# Patient Record
Sex: Female | Born: 1995 | Race: White | Hispanic: No | Marital: Married | Smoking: Never smoker
Health system: Southern US, Community
[De-identification: ages and names within clinical notes are randomized; demographics above are authoritative.]

## PROBLEM LIST (undated history)

## (undated) ENCOUNTER — Inpatient Hospital Stay (HOSPITAL_COMMUNITY): Payer: Self-pay

## (undated) DIAGNOSIS — F419 Anxiety disorder, unspecified: Secondary | ICD-10-CM

## (undated) DIAGNOSIS — F32A Depression, unspecified: Secondary | ICD-10-CM

## (undated) DIAGNOSIS — F329 Major depressive disorder, single episode, unspecified: Secondary | ICD-10-CM

## (undated) HISTORY — DX: Anxiety disorder, unspecified: F41.9

## (undated) HISTORY — DX: Major depressive disorder, single episode, unspecified: F32.9

## (undated) HISTORY — DX: Depression, unspecified: F32.A

## (undated) HISTORY — PX: NO PAST SURGERIES: SHX2092

---
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
## 2013-01-03 ENCOUNTER — Encounter (HOSPITAL_COMMUNITY): Payer: Self-pay | Admitting: Psychology

## 2013-01-03 DIAGNOSIS — F331 Major depressive disorder, recurrent, moderate: Secondary | ICD-10-CM

## 2013-01-03 DIAGNOSIS — F4010 Social phobia, unspecified: Secondary | ICD-10-CM

## 2013-01-03 NOTE — Progress Notes (Signed)
Outpatient Therapist Discharge Summary  JADYNN EPPING    1996-06-23   Admission Date: 05/18/12   Discharge Date:  01/02/13 Reason for Discharge:  Not active in tx since 07/06/12 Diagnosis: Major depressive disorder, recurrent episode, moderate  Social phobia   Comments:  Pt seen by Jorje Guild on 10/12/12 and indicated not looking for services at this time.  Forde Radon

## 2014-08-23 ENCOUNTER — Inpatient Hospital Stay (HOSPITAL_COMMUNITY): Payer: Medicaid Other

## 2014-08-23 ENCOUNTER — Encounter (HOSPITAL_COMMUNITY): Payer: Self-pay | Admitting: *Deleted

## 2014-08-23 ENCOUNTER — Inpatient Hospital Stay (HOSPITAL_COMMUNITY)
Admission: AD | Admit: 2014-08-23 | Discharge: 2014-08-23 | Disposition: A | Discharge status: Home / Self Care | Payer: Medicaid Other | Source: Ambulatory Visit | Attending: Obstetrics & Gynecology | Admitting: Obstetrics & Gynecology

## 2014-08-23 DIAGNOSIS — Z3490 Encounter for supervision of normal pregnancy, unspecified, unspecified trimester: Secondary | ICD-10-CM

## 2014-08-23 DIAGNOSIS — Z3A01 Less than 8 weeks gestation of pregnancy: Secondary | ICD-10-CM | POA: Insufficient documentation

## 2014-08-23 DIAGNOSIS — O26899 Other specified pregnancy related conditions, unspecified trimester: Secondary | ICD-10-CM

## 2014-08-23 DIAGNOSIS — O209 Hemorrhage in early pregnancy, unspecified: Secondary | ICD-10-CM | POA: Insufficient documentation

## 2014-08-23 DIAGNOSIS — O360111 Maternal care for anti-D [Rh] antibodies, first trimester, fetus 1: Secondary | ICD-10-CM

## 2014-08-23 DIAGNOSIS — Z87891 Personal history of nicotine dependence: Secondary | ICD-10-CM | POA: Diagnosis not present

## 2014-08-23 DIAGNOSIS — Z6791 Unspecified blood type, Rh negative: Secondary | ICD-10-CM | POA: Diagnosis present

## 2014-08-23 DIAGNOSIS — R58 Hemorrhage, not elsewhere classified: Secondary | ICD-10-CM

## 2014-08-23 LAB — WET PREP, GENITAL
Clue Cells Wet Prep HPF POC: NONE SEEN
Trich, Wet Prep: NONE SEEN
Yeast Wet Prep HPF POC: NONE SEEN

## 2014-08-23 LAB — URINALYSIS, ROUTINE W REFLEX MICROSCOPIC
Bilirubin Urine: NEGATIVE
GLUCOSE, UA: NEGATIVE mg/dL
Ketones, ur: NEGATIVE mg/dL
Leukocytes, UA: NEGATIVE
Nitrite: NEGATIVE
PH: 8.5 — AB (ref 5.0–8.0)
Protein, ur: NEGATIVE mg/dL
SPECIFIC GRAVITY, URINE: 1.015 (ref 1.005–1.030)
Urobilinogen, UA: 1 mg/dL (ref 0.0–1.0)

## 2014-08-23 LAB — CBC
HEMATOCRIT: 39.4 % (ref 36.0–46.0)
HEMOGLOBIN: 13.5 g/dL (ref 12.0–15.0)
MCH: 32.6 pg (ref 26.0–34.0)
MCHC: 34.3 g/dL (ref 30.0–36.0)
MCV: 95.2 fL (ref 78.0–100.0)
Platelets: 158 10*3/uL (ref 150–400)
RBC: 4.14 MIL/uL (ref 3.87–5.11)
RDW: 11.8 % (ref 11.5–15.5)
WBC: 6.3 10*3/uL (ref 4.0–10.5)

## 2014-08-23 LAB — URINE MICROSCOPIC-ADD ON

## 2014-08-23 LAB — POCT PREGNANCY, URINE: PREG TEST UR: POSITIVE — AB

## 2014-08-23 LAB — ABO/RH: ABO/RH(D): A NEG

## 2014-08-23 LAB — OB RESULTS CONSOLE HEPATITIS B SURFACE ANTIGEN: Hepatitis B Surface Ag: NEGATIVE

## 2014-08-23 LAB — OB RESULTS CONSOLE GC/CHLAMYDIA: GC PROBE AMP, GENITAL: NEGATIVE

## 2014-08-23 LAB — HCG, QUANTITATIVE, PREGNANCY: hCG, Beta Chain, Quant, S: 7731 m[IU]/mL — ABNORMAL HIGH (ref <5)

## 2014-08-23 LAB — OB RESULTS CONSOLE ABO/RH: ABO/RH(D): NEGATIVE

## 2014-08-23 LAB — OB RESULTS CONSOLE RUBELLA ANTIBODY, IGM: RUBELLA: IMMUNE

## 2014-08-23 LAB — OB RESULTS CONSOLE ANTIBODY SCREEN: Antibody Screen: POSITIVE

## 2014-08-23 LAB — OB RESULTS CONSOLE HIV ANTIBODY (ROUTINE TESTING): HIV: NONREACTIVE

## 2014-08-23 LAB — OB RESULTS CONSOLE RPR: RPR: NONREACTIVE

## 2014-08-23 MED ORDER — RHO D IMMUNE GLOBULIN 1500 UNIT/2ML IJ SOSY
300.0000 ug | PREFILLED_SYRINGE | Freq: Once | INTRAMUSCULAR | Status: AC
  Administered 2014-08-23: 300 ug via INTRAMUSCULAR
  Filled 2014-08-23: qty 2

## 2014-08-23 NOTE — Discharge Instructions (Signed)
First Trimester of Pregnancy °The first trimester of pregnancy is from week 1 until the end of week 12 (months 1 through 3). A week after a sperm fertilizes an egg, the egg will implant on the wall of the uterus. This embryo will begin to develop into a baby. Genes from you and your partner are forming the baby. The female genes determine whether the baby is a boy or a girl. At 6-8 weeks, the eyes and face are formed, and the heartbeat can be seen on ultrasound. At the end of 12 weeks, all the baby's organs are formed.  °Now that you are pregnant, you will want to do everything you can to have a healthy baby. Two of the most important things are to get good prenatal care and to follow your health care provider's instructions. Prenatal care is all the medical care you receive before the baby's birth. This care will help prevent, find, and treat any problems during the pregnancy and childbirth. °BODY CHANGES °Your body goes through many changes during pregnancy. The changes vary from woman to woman.  °· You may gain or lose a couple of pounds at first. °· You may feel sick to your stomach (nauseous) and throw up (vomit). If the vomiting is uncontrollable, call your health care provider. °· You may tire easily. °· You may develop headaches that can be relieved by medicines approved by your health care provider. °· You may urinate more often. Painful urination may mean you have a bladder infection. °· You may develop heartburn as a result of your pregnancy. °· You may develop constipation because certain hormones are causing the muscles that push waste through your intestines to slow down. °· You may develop hemorrhoids or swollen, bulging veins (varicose veins). °· Your breasts may begin to grow larger and become tender. Your nipples may stick out more, and the tissue that surrounds them (areola) may become darker. °· Your gums may bleed and may be sensitive to brushing and flossing. °· Dark spots or blotches (chloasma,  mask of pregnancy) may develop on your face. This will likely fade after the baby is born. °· Your menstrual periods will stop. °· You may have a loss of appetite. °· You may develop cravings for certain kinds of food. °· You may have changes in your emotions from day to day, such as being excited to be pregnant or being concerned that something may go wrong with the pregnancy and baby. °· You may have more vivid and strange dreams. °· You may have changes in your hair. These can include thickening of your hair, rapid growth, and changes in texture. Some women also have hair loss during or after pregnancy, or hair that feels dry or thin. Your hair will most likely return to normal after your baby is born. °WHAT TO EXPECT AT YOUR PRENATAL VISITS °During a routine prenatal visit: °· You will be weighed to make sure you and the baby are growing normally. °· Your blood pressure will be taken. °· Your abdomen will be measured to track your baby's growth. °· The fetal heartbeat will be listened to starting around week 10 or 12 of your pregnancy. °· Test results from any previous visits will be discussed. °Your health care provider may ask you: °· How you are feeling. °· If you are feeling the baby move. °· If you have had any abnormal symptoms, such as leaking fluid, bleeding, severe headaches, or abdominal cramping. °· If you have any questions. °Other tests   that may be performed during your first trimester include: °· Blood tests to find your blood type and to check for the presence of any previous infections. They will also be used to check for low iron levels (anemia) and Rh antibodies. Later in the pregnancy, blood tests for diabetes will be done along with other tests if problems develop. °· Urine tests to check for infections, diabetes, or protein in the urine. °· An ultrasound to confirm the proper growth and development of the baby. °· An amniocentesis to check for possible genetic problems. °· Fetal screens for  spina bifida and Down syndrome. °· You may need other tests to make sure you and the baby are doing well. °HOME CARE INSTRUCTIONS  °Medicines °· Follow your health care provider's instructions regarding medicine use. Specific medicines may be either safe or unsafe to take during pregnancy. °· Take your prenatal vitamins as directed. °· If you develop constipation, try taking a stool softener if your health care provider approves. °Diet °· Eat regular, well-balanced meals. Choose a variety of foods, such as meat or vegetable-based protein, fish, milk and low-fat dairy products, vegetables, fruits, and whole grain breads and cereals. Your health care provider will help you determine the amount of weight gain that is right for you. °· Avoid raw meat and uncooked cheese. These carry germs that can cause birth defects in the baby. °· Eating four or five small meals rather than three large meals a day may help relieve nausea and vomiting. If you start to feel nauseous, eating a few soda crackers can be helpful. Drinking liquids between meals instead of during meals also seems to help nausea and vomiting. °· If you develop constipation, eat more high-fiber foods, such as fresh vegetables or fruit and whole grains. Drink enough fluids to keep your urine clear or pale yellow. °Activity and Exercise °· Exercise only as directed by your health care provider. Exercising will help you: °¨ Control your weight. °¨ Stay in shape. °¨ Be prepared for labor and delivery. °· Experiencing pain or cramping in the lower abdomen or low back is a good sign that you should stop exercising. Check with your health care provider before continuing normal exercises. °· Try to avoid standing for long periods of time. Move your legs often if you must stand in one place for a long time. °· Avoid heavy lifting. °· Wear low-heeled shoes, and practice good posture. °· You may continue to have sex unless your health care provider directs you  otherwise. °Relief of Pain or Discomfort °· Wear a good support bra for breast tenderness.   °· Take warm sitz baths to soothe any pain or discomfort caused by hemorrhoids. Use hemorrhoid cream if your health care provider approves.   °· Rest with your legs elevated if you have leg cramps or low back pain. °· If you develop varicose veins in your legs, wear support hose. Elevate your feet for 15 minutes, 3-4 times a day. Limit salt in your diet. °Prenatal Care °· Schedule your prenatal visits by the twelfth week of pregnancy. They are usually scheduled monthly at first, then more often in the last 2 months before delivery. °· Write down your questions. Take them to your prenatal visits. °· Keep all your prenatal visits as directed by your health care provider. °Safety °· Wear your seat belt at all times when driving. °· Make a list of emergency phone numbers, including numbers for family, friends, the hospital, and police and fire departments. °General Tips °·   Ask your health care provider for a referral to a local prenatal education class. Begin classes no later than at the beginning of month 6 of your pregnancy. °· Ask for help if you have counseling or nutritional needs during pregnancy. Your health care provider can offer advice or refer you to specialists for help with various needs. °· Do not use hot tubs, steam rooms, or saunas. °· Do not douche or use tampons or scented sanitary pads. °· Do not cross your legs for long periods of time. °· Avoid cat litter boxes and soil used by cats. These carry germs that can cause birth defects in the baby and possibly loss of the fetus by miscarriage or stillbirth. °· Avoid all smoking, herbs, alcohol, and medicines not prescribed by your health care provider. Chemicals in these affect the formation and growth of the baby. °· Schedule a dentist appointment. At home, brush your teeth with a soft toothbrush and be gentle when you floss. °SEEK MEDICAL CARE IF:  °· You have  dizziness. °· You have mild pelvic cramps, pelvic pressure, or nagging pain in the abdominal area. °· You have persistent nausea, vomiting, or diarrhea. °· You have a bad smelling vaginal discharge. °· You have pain with urination. °· You notice increased swelling in your face, hands, legs, or ankles. °SEEK IMMEDIATE MEDICAL CARE IF:  °· You have a fever. °· You are leaking fluid from your vagina. °· You have spotting or bleeding from your vagina. °· You have severe abdominal cramping or pain. °· You have rapid weight gain or loss. °· You vomit blood or material that looks like coffee grounds. °· You are exposed to German measles and have never had them. °· You are exposed to fifth disease or chickenpox. °· You develop a severe headache. °· You have shortness of breath. °· You have any kind of trauma, such as from a fall or a car accident. °Document Released: 09/07/2001 Document Revised: 01/28/2014 Document Reviewed: 07/24/2013 °[employer]® Patient Information ©2015 [employer]. This information is not intended to replace advice given to you by your health care provider. Make sure you discuss any questions you have with your health care provider. °Vaginal Bleeding During Pregnancy, First Trimester °A small amount of bleeding (spotting) from the vagina is relatively common in early pregnancy. It usually stops on its own. Various things may cause bleeding or spotting in early pregnancy. Some bleeding may be related to the pregnancy, and some may not. In most cases, the bleeding is normal and is not a problem. However, bleeding can also be a sign of something serious. Be sure to tell your health care provider about any vaginal bleeding right away. °Some possible causes of vaginal bleeding during the first trimester include: °· Infection or inflammation of the cervix. °· Growths (polyps) on the cervix. °· Miscarriage or threatened miscarriage. °· Pregnancy tissue has developed outside of the uterus and in a fallopian  tube (tubal pregnancy). °· Tiny cysts have developed in the uterus instead of pregnancy tissue (molar pregnancy). °HOME CARE INSTRUCTIONS  °Watch your condition for any changes. The following actions may help to lessen any discomfort you are feeling: °· Follow your health care provider's instructions for limiting your activity. If your health care provider orders bed rest, you may need to stay in bed and only get up to use the bathroom. However, your health care provider may allow you to continue light activity. °· If needed, make plans for someone to help with your regular activities   and responsibilities while you are on bed rest. °· Keep track of the number of pads you use each day, how often you change pads, and how soaked (saturated) they are. Write this down. °· Do not use tampons. Do not douche. °· Do not have sexual intercourse or orgasms until approved by your health care provider. °· If you pass any tissue from your vagina, save the tissue so you can show it to your health care provider. °· Only take over-the-counter or prescription medicines as directed by your health care provider. °· Do not take aspirin because it can make you bleed. °· Keep all follow-up appointments as directed by your health care provider. °SEEK MEDICAL CARE IF: °· You have any vaginal bleeding during any part of your pregnancy. °· You have cramps or labor pains. °· You have a fever, not controlled by medicine. °SEEK IMMEDIATE MEDICAL CARE IF:  °· You have severe cramps in your back or belly (abdomen). °· You pass large clots or tissue from your vagina. °· Your bleeding increases. °· You feel light-headed or weak, or you have fainting episodes. °· You have chills. °· You are leaking fluid or have a gush of fluid from your vagina. °· You pass out while having a bowel movement. °MAKE SURE YOU: °· Understand these instructions. °· Will watch your condition. °· Will get help right away if you are not doing well or get worse. °Document  Released: 06/23/2005 Document Revised: 09/18/2013 Document Reviewed: 05/21/2013 °ExitCare® Patient Information ©2015 [employer]. This information is not intended to replace advice given to you by your health care provider. Make sure you discuss any questions you have with your health care provider. ° °

## 2014-08-23 NOTE — MAU Note (Signed)
Started bleeding about ago. Was dark with small clots, now pinkish.

## 2014-08-23 NOTE — MAU Provider Note (Signed)
History     CSN: [id number]  Arrival date and time: 08/23/14 1442   First Provider Initiated Contact with Patient 08/23/14 1733      Chief Complaint  Patient presents with  . Vaginal Bleeding  . Possible Pregnancy   HPI Comments: Judith Osborn 18 y.o. G1P0 [redacted]w[redacted]d presents to MAU with vaginal bleeding that started as dark red with clots and now is pink. This started after intercourse today. She has not established prenatal care. Her blood type is A negative  Vaginal Bleeding  Possible Pregnancy      Past Medical History  Diagnosis Date  . Depression   . Anxiety     Past Surgical History  Procedure Laterality Date  . No past surgeries      Family History  Problem Relation Age of Onset  . Alcohol abuse Father     inpt tx 2007  . Suicidality Father     2007 inpt tx    History  Substance Use Topics  . Smoking status: Former Games developer  . Smokeless tobacco: Never Used  . Alcohol Use: No    Allergies: No Known Allergies  Prescriptions prior to admission  Medication Sig Dispense Refill Last Dose  . Prenatal Vit-Fe Fumarate-FA (PRENATAL MULTIVITAMIN) TABS tablet Take 1 tablet by mouth daily at 12 noon.   08/22/2014 at Unknown time  . nitrofurantoin, macrocrystal-monohydrate, (MACROBID) 100 MG capsule Take 1 capsule (100 mg total) by mouth 2 (two) times daily. (Patient not taking: Reported on 08/23/2014) 10 capsule 0     Review of Systems  Constitutional: Negative.   HENT: Negative.   Eyes: Negative.   Respiratory: Negative.   Cardiovascular: Negative.   Gastrointestinal: Negative.   Genitourinary: Positive for vaginal bleeding.       Vaginal bleeding  Musculoskeletal: Negative.   Skin: Negative.   Neurological: Negative.   Psychiatric/Behavioral: The patient is nervous/anxious.    Physical Exam   Blood pressure 108/58, pulse 92, temperature 98.1 F (36.7 C), temperature source Oral, resp. rate 16, height 5\' 4"  (1.626 m), weight 76.204 kg (168 lb), last  menstrual period 07/08/2014.  Physical Exam  Constitutional: She is oriented to person, place, and time. She appears well-developed and well-nourished. No distress.  HENT:  Head: Normocephalic and atraumatic.  GI: Soft. She exhibits no distension. There is no tenderness. There is no rebound.  Genitourinary:  Genital:external negative Vaginal: tiny amount blood/ from os Cervix:closed/ thick Bimanual: nontender   Musculoskeletal: Normal range of motion.  Neurological: She is alert and oriented to person, place, and time.  Skin: Skin is warm and dry.  Psychiatric: She has a normal mood and affect. Her behavior is normal. Judgment and thought content normal.   Results for orders placed or performed during the hospital encounter of 08/23/14 (from the past 24 hour(s))  Urinalysis, Routine w reflex microscopic     Status: Abnormal   Collection Time: 08/23/14  3:05 PM  Result Value Ref Range   Color, Urine YELLOW YELLOW   APPearance CLOUDY (A) CLEAR   Specific Gravity, Urine 1.015 1.005 - 1.030   pH 8.5 (H) 5.0 - 8.0   Glucose, UA NEGATIVE NEGATIVE mg/dL   Hgb urine dipstick LARGE (A) NEGATIVE   Bilirubin Urine NEGATIVE NEGATIVE   Ketones, ur NEGATIVE NEGATIVE mg/dL   Protein, ur NEGATIVE NEGATIVE mg/dL   Urobilinogen, UA 1.0 0.0 - 1.0 mg/dL   Nitrite NEGATIVE NEGATIVE   Leukocytes, UA NEGATIVE NEGATIVE  Urine microscopic-add on     Status:  Abnormal   Collection Time: 08/23/14  3:05 PM  Result Value Ref Range   Squamous Epithelial / LPF FEW (A) RARE   WBC, UA 0-2 <3 WBC/hpf   RBC / HPF 0-2 <3 RBC/hpf   Urine-Other AMORPHOUS URATES/PHOSPHATES   Pregnancy, urine POC     Status: Abnormal   Collection Time: 08/23/14  3:18 PM  Result Value Ref Range   Preg Test, Ur POSITIVE (A) NEGATIVE  CBC     Status: None   Collection Time: 08/23/14  3:37 PM  Result Value Ref Range   WBC 6.3 4.0 - 10.5 K/uL   RBC 4.14 3.87 - 5.11 MIL/uL   Hemoglobin 13.5 12.0 - 15.0 g/dL   HCT 16.139.4 09.636.0 -  04.546.0 %   MCV 95.2 78.0 - 100.0 fL   MCH 32.6 26.0 - 34.0 pg   MCHC 34.3 30.0 - 36.0 g/dL   RDW 40.911.8 81.111.5 - 91.415.5 %   Platelets 158 150 - 400 K/uL  hCG, quantitative, pregnancy     Status: Abnormal   Collection Time: 08/23/14  3:37 PM  Result Value Ref Range   hCG, Beta Chain, Quant, S 7731 (H) <5 mIU/mL  ABO/Rh     Status: None   Collection Time: 08/23/14  3:37 PM  Result Value Ref Range   ABO/RH(D) A NEG   Wet prep, genital     Status: Abnormal   Collection Time: 08/23/14  5:53 PM  Result Value Ref Range   Yeast Wet Prep HPF POC NONE SEEN NONE SEEN   Trich, Wet Prep NONE SEEN NONE SEEN   Clue Cells Wet Prep HPF POC NONE SEEN NONE SEEN   WBC, Wet Prep HPF POC FEW (A) NONE SEEN  Rh IG workup (includes ABO/Rh)     Status: None (Preliminary result)   Collection Time: 08/23/14  5:57 PM  Result Value Ref Range   Gestational Age(Wks) 6.4    ABO/RH(D) A NEG    Antibody Screen NEG    Unit Number [id number]    Blood Component Type RHIG    Unit division 00    Status of Unit ISSUED    Transfusion Status OK TO TRANSFUSE    [place] Ob Comp Less 14 Wks  08/23/2014   CLINICAL DATA:  Pregnant, bleeding  EXAM: OBSTETRIC <14 WK US AND TRANSVAGINAL OB US  TECHNIQUE: Both transabdominal and transvaginal ultrasound examinations were performed for complete evaluation of the gestation as well as the maternal uterus, adnexal regions, and pelvic cul-de-sac. Transvaginal technique was performed to assess early pregnancy.  COMPARISON:  None.  FINDINGS: Intrauterine gestational sac: Visualized/normal in shape.  Yolk sac:  Present  Embryo:  Not visualized  MSD:  8.3  mm   5 w   3  d  US EDC: 04/22/2015  Maternal uterus/adnexae: Small subchorionic hemorrhage.  Left ovary is within normal limits, measuring 2.9 x 1.7 x 2.0 cm.  Right ovary measures 3.2 x 2.0 x 2.9 cm and is notable for a 1.7 cm probable corpus luteal cyst.  Trace pelvic fluid.  IMPRESSION: Single intrauterine gestational sac with yolk sac, measuring  5 weeks 3 days by mean sac diameter.  No fetal pole is visualized. Consider follow-up pelvic ultrasound in 14 days to confirm viability.   Electronically Signed   By: Charline BillsSriyesh  Krishnan M.D.   On: 08/23/2014 18:35   [place] Ob Transvaginal  08/23/2014   CLINICAL DATA:  Pregnant, bleeding  EXAM: OBSTETRIC <14 WK US AND TRANSVAGINAL OB US  TECHNIQUE:  Both transabdominal and transvaginal ultrasound examinations were performed for complete evaluation of the gestation as well as the maternal uterus, adnexal regions, and pelvic cul-de-sac. Transvaginal technique was performed to assess early pregnancy.  COMPARISON:  None.  FINDINGS: Intrauterine gestational sac: Visualized/normal in shape.  Yolk sac:  Present  Embryo:  Not visualized  MSD:  8.3  mm   5 w   3  d  US EDC: 04/22/2015  Maternal uterus/adnexae: Small subchorionic hemorrhage.  Left ovary is within normal limits, measuring 2.9 x 1.7 x 2.0 cm.  Right ovary measures 3.2 x 2.0 x 2.9 cm and is notable for a 1.7 cm probable corpus luteal cyst.  Trace pelvic fluid.  IMPRESSION: Single intrauterine gestational sac with yolk sac, measuring 5 weeks 3 days by mean sac diameter.  No fetal pole is visualized. Consider follow-up pelvic ultrasound in 14 days to confirm viability.   Electronically Signed   By: Charline BillsSriyesh  Krishnan M.D.   On: 08/23/2014 18:35    MAU Course  Procedures  MDM Wet prep, GC, Chlamydia, CBC, UA, U/S, ABORh, Quant Negative blood type/ RHIG and Rhophylac ordered  Assessment and Plan   A: Bleeding in early pregnancy  P: Above orders Reviewed all labs and ultrasound with patient and FOB Rhophylac 300 mcg IM Advised to [facility] and start [facility] Return to MAU as needed  Carolynn ServeBarefoot, Cynitha Berte Miller 08/23/2014, 6:00 PM

## 2014-08-24 LAB — GC/CHLAMYDIA PROBE AMP
CT Probe RNA: NEGATIVE
GC Probe RNA: NEGATIVE

## 2014-08-24 LAB — RH IG WORKUP (INCLUDES ABO/RH)
ABO/RH(D): A NEG
Antibody Screen: NEGATIVE
GESTATIONAL AGE(WKS): 6.4
Unit division: 0

## 2014-08-25 LAB — CULTURE, OB URINE: SPECIAL REQUESTS: NORMAL

## 2014-08-28 ENCOUNTER — Inpatient Hospital Stay (HOSPITAL_COMMUNITY)
Admission: AD | Admit: 2014-08-28 | Discharge: 2014-08-28 | Disposition: A | Discharge status: Home / Self Care | Payer: Medicaid Other | Source: Ambulatory Visit | Attending: Obstetrics & Gynecology | Admitting: Obstetrics & Gynecology

## 2014-08-28 ENCOUNTER — Encounter (HOSPITAL_COMMUNITY): Payer: Self-pay

## 2014-08-28 DIAGNOSIS — O360910 Maternal care for other rhesus isoimmunization, first trimester, not applicable or unspecified: Secondary | ICD-10-CM | POA: Insufficient documentation

## 2014-08-28 DIAGNOSIS — O209 Hemorrhage in early pregnancy, unspecified: Secondary | ICD-10-CM | POA: Diagnosis not present

## 2014-08-28 DIAGNOSIS — O4691 Antepartum hemorrhage, unspecified, first trimester: Secondary | ICD-10-CM

## 2014-08-28 DIAGNOSIS — Z3A01 Less than 8 weeks gestation of pregnancy: Secondary | ICD-10-CM | POA: Insufficient documentation

## 2014-08-28 DIAGNOSIS — Z87891 Personal history of nicotine dependence: Secondary | ICD-10-CM | POA: Insufficient documentation

## 2014-08-28 DIAGNOSIS — O360111 Maternal care for anti-D [Rh] antibodies, first trimester, fetus 1: Secondary | ICD-10-CM

## 2014-08-28 NOTE — Discharge Instructions (Signed)
Vaginal Bleeding During Pregnancy, First Trimester °A small amount of bleeding (spotting) from the vagina is relatively common in early pregnancy. It usually stops on its own. Various things may cause bleeding or spotting in early pregnancy. Some bleeding may be related to the pregnancy, and some may not. In most cases, the bleeding is normal and is not a problem. However, bleeding can also be a sign of something serious. Be sure to tell your health care provider about any vaginal bleeding right away. °Some possible causes of vaginal bleeding during the first trimester include: °· Infection or inflammation of the cervix. °· Growths (polyps) on the cervix. °· Miscarriage or threatened miscarriage. °· Pregnancy tissue has developed outside of the uterus and in a fallopian tube (tubal pregnancy). °· Tiny cysts have developed in the uterus instead of pregnancy tissue (molar pregnancy). °HOME CARE INSTRUCTIONS  °Watch your condition for any changes. The following actions may help to lessen any discomfort you are feeling: °· Follow your health care provider's instructions for limiting your activity. If your health care provider orders bed rest, you may need to stay in bed and only get up to use the bathroom. However, your health care provider may allow you to continue light activity. °· If needed, make plans for someone to help with your regular activities and responsibilities while you are on bed rest. °· Keep track of the number of pads you use each day, how often you change pads, and how soaked (saturated) they are. Write this down. °· Do not use tampons. Do not douche. °· Do not have sexual intercourse or orgasms until approved by your health care provider. °· If you pass any tissue from your vagina, save the tissue so you can show it to your health care provider. °· Only take over-the-counter or prescription medicines as directed by your health care provider. °· Do not take aspirin because it can make you  bleed. °· Keep all follow-up appointments as directed by your health care provider. °SEEK MEDICAL CARE IF: °· You have any vaginal bleeding during any part of your pregnancy. °· You have cramps or labor pains. °· You have a fever, not controlled by medicine. °SEEK IMMEDIATE MEDICAL CARE IF:  °· You have severe cramps in your back or belly (abdomen). °· You pass large clots or tissue from your vagina. °· Your bleeding increases. °· You feel light-headed or weak, or you have fainting episodes. °· You have chills. °· You are leaking fluid or have a gush of fluid from your vagina. °· You pass out while having a bowel movement. °MAKE SURE YOU: °· Understand these instructions. °· Will watch your condition. °· Will get help right away if you are not doing well or get worse. °Document Released: 06/23/2005 Document Revised: 09/18/2013 Document Reviewed: 05/21/2013 °ExitCare® Patient Information ©2015 [employer]. This information is not intended to replace advice given to you by your health care provider. Make sure you discuss any questions you have with your health care provider. ° °Rh Incompatibility °Rh incompatibility is a condition that occurs during pregnancy if a woman has Rh-negative blood and her baby has Rh-positive blood. "Rh-negative" and "Rh-positive" refer to whether or not the blood has an Rh factor. An Rh factor is a specific protein found on the surface of red blood cells. If a woman has Rh factor, she is Rh-positive. If she does not have an Rh factor, she is Rh-negative. Having or not having an Rh factor does not affect the mother's general   health. However, it can cause problems during pregnancy.  °WHAT KIND OF PROBLEMS CAN Rh INCOMPATIBILITY CAUSE? °During pregnancy, blood from the baby can cross into the mother's bloodstream, especially during delivery. If a mother is Rh-negative and the baby is Rh-positive, the mother's defense system will react to the baby's blood as if it was a foreign substance and  will create proteins (antibodies). This is called sensitization. Once the mother is sensitized, her Rh antibodies will cross the placenta to the baby and attack the baby's Rh-positive blood as if it is a harmful substance.  °Rh incompatibility can also happen if the Rh-negative pregnant woman is exposed to the Rh factor during a blood transfusion with Rh-positive blood.  °HOW DOES THIS CONDITION AFFECT MY BABY? °The Rh antibodies that attack and destroy the baby's red blood cells can lead to hemolytic disease in the baby. Hemolytic disease is when the red blood cells break down. This can cause:  °· Yellowing of the skin and eyes (jaundice). °· The body to not have enough healthy red blood cells (anemia).   °· Brain damage.   °· Heart failure.   °· Death.   °These antibodies usually do not cause problems during a first pregnancy. This is because the blood from the baby often times crosses into the mother's bloodstream during delivery, and the baby is born before many of the antibodies can develop. However, the antibodies stay in your body once they have formed. Because of this, Rh incompatibility is more likely to cause problems in second or later pregnancies (if the baby is Rh-positive).  °HOW IS THIS CONDITION DIAGNOSED? °When a woman becomes pregnant, blood tests may be done to find out her blood type and Rh factor. If the woman is Rh-negative, she also may have another blood test called an antibody screen. The antibody screen shows whether she has Rh antibodies in her blood. If she does, it means she was exposed to Rh-positive blood before, and she is at risk for Rh incompatibility.   °To find out whether the baby is developing hemolytic anemia and how serious it is, caregivers may use more advanced tests, such as ultrasonography (commonly known as ultrasound).  °HOW IS Rh INCOMPATIBILITY TREATED?  °Rh incompatibility is treated with a shot of medicine called Rho (D) immune globulin. This medicine keeps the  woman's body from making antibodies that can cause serious problems in the baby or future babies.  °Two shots will be given, one at around your seventh month of pregnancy and the other within 72 hours of your baby being born. If you are Rh-negative, you will need this medicine every time you have a baby with Rh-positive blood. If you already have antibodies in your blood, Rho (D) immune globulin will not help. Your doctor will not give you this medicine, but will watch your pregnancy closely for problems instead.   °This shot may also be given to an Rh-negative woman when the risk of blood transfer between the mom and baby is high. The risk is high with:  °· An amniocentesis.   °· A miscarriage or an abortion.   °· An ectopic pregnancy.   °· Any vaginal bleeding during pregnancy.   °Document Released: 03/05/2002 Document Revised: 09/18/2013 Document Reviewed: 12/26/2012 °ExitCare® Patient Information ©2015 [employer]. This information is not intended to replace advice given to you by your health care provider. Make sure you discuss any questions you have with your health care provider. ° ° °

## 2014-08-28 NOTE — MAU Provider Note (Signed)
  History     CSN: [id number]  Arrival date and time: 08/28/14 [id number]   First Provider Initiated Contact with Patient 08/28/14 2041      Chief Complaint  Patient presents with  . Vaginal Bleeding   HPI Comments: Judith Osborn 18 y.o. G1P0 [id number] presents to MAU with vaginal bleeding after intercourse. She was under the impression that every bleeding episode would require Rhogam as she is RH negative. She has not established prenatal care as yet.   Vaginal Bleeding      Past Medical History  Diagnosis Date  . Depression   . Anxiety     Past Surgical History  Procedure Laterality Date  . No past surgeries      Family History  Problem Relation Age of Onset  . Alcohol abuse Father     inpt tx 2007  . Suicidality Father     2007 inpt tx    History  Substance Use Topics  . Smoking status: Former Games developermoker  . Smokeless tobacco: Never Used  . Alcohol Use: No    Allergies: No Known Allergies  Prescriptions prior to admission  Medication Sig Dispense Refill Last Dose  . Prenatal Vit-Fe Fumarate-FA (PRENATAL MULTIVITAMIN) TABS tablet Take 1 tablet by mouth daily at 12 noon.   08/22/2014 at Unknown time    Review of Systems  Constitutional: Negative.   HENT: Negative.   Eyes: Negative.   Respiratory: Negative.   Cardiovascular: Negative.   Genitourinary: Positive for vaginal bleeding.       Vaginal bleeding  Skin: Negative.   Neurological: Negative.   Psychiatric/Behavioral: Negative.    Physical Exam   Blood pressure 118/67, pulse 75, temperature 98.8 F (37.1 C), temperature source Oral, resp. rate 16, last menstrual period 07/08/2014, SpO2 99 %.  Physical Exam  Constitutional: She appears well-developed and well-nourished. No distress.  HENT:  Head: Normocephalic and atraumatic.  Respiratory: Effort normal.  GI: Soft.  Genitourinary:  Genital:external negative Vaginal:small amount pink discharge Cervix:closed and thick Bimanual:nontender    Musculoskeletal: Normal range of motion.  Neurological: She is alert.  Skin: Skin is warm and dry.  Psychiatric: She has a normal mood and affect. Her behavior is normal. Judgment and thought content normal.   No results found for this or any previous visit (from the past 24 hour(s)).  MAU Course  Procedures  MDM   Assessment and Plan   A: Vaginal bleeding RH negative  P: Reassurance that she is covered for 14 weeks Establish prenatal care Continue PNV Return to MAU as needed  Carolynn ServeBarefoot, Tylen Leverich Miller 08/28/2014, 8:42 PM

## 2014-08-28 NOTE — MAU Note (Signed)
Patient states has had spotting today after having intercourse last night. Denies pain.

## 2014-09-27 NOTE — L&D Delivery Note (Signed)
Delivery Note  At 11:25 AM a viable and healthy female Zollie Scale)  was delivered by [employer], CNM via Vaginal, Spontaneous Delivery (Presentation: Left Occiput Anterior).  APGAR: 7, 9; weight 7 lb 10.1 oz (3460 g).   Placenta status: Intact, Spontaneous.  Cord: 3 vessels with the following complications: None.  Anesthesia: Epidural Local  Episiotomy: None Lacerations: 2nd degree Suture Repair: 3.0 vicryl Est. Blood Loss (mL):  453 cc's.   Mom to postpartum.   Baby A to Couplet care / Skin to Skin.   Baby B to NA.  Kirkland Hun V 04/22/2015, 12:09 PM

## 2014-12-21 ENCOUNTER — Inpatient Hospital Stay (HOSPITAL_COMMUNITY)
Admission: AD | Admit: 2014-12-21 | Discharge: 2014-12-21 | Disposition: A | Discharge status: Home / Self Care | Payer: Medicaid Other | Source: Ambulatory Visit | Attending: Obstetrics and Gynecology | Admitting: Obstetrics and Gynecology

## 2014-12-21 ENCOUNTER — Encounter (HOSPITAL_COMMUNITY): Payer: Self-pay | Admitting: *Deleted

## 2014-12-21 DIAGNOSIS — R109 Unspecified abdominal pain: Secondary | ICD-10-CM | POA: Diagnosis present

## 2014-12-21 DIAGNOSIS — K30 Functional dyspepsia: Secondary | ICD-10-CM | POA: Diagnosis not present

## 2014-12-21 DIAGNOSIS — Z6791 Unspecified blood type, Rh negative: Secondary | ICD-10-CM | POA: Insufficient documentation

## 2014-12-21 DIAGNOSIS — Z3A22 22 weeks gestation of pregnancy: Secondary | ICD-10-CM | POA: Insufficient documentation

## 2014-12-21 DIAGNOSIS — O26893 Other specified pregnancy related conditions, third trimester: Secondary | ICD-10-CM | POA: Insufficient documentation

## 2014-12-21 DIAGNOSIS — Z87891 Personal history of nicotine dependence: Secondary | ICD-10-CM | POA: Insufficient documentation

## 2014-12-21 DIAGNOSIS — O9989 Other specified diseases and conditions complicating pregnancy, childbirth and the puerperium: Secondary | ICD-10-CM | POA: Diagnosis not present

## 2014-12-21 LAB — URINALYSIS, ROUTINE W REFLEX MICROSCOPIC
BILIRUBIN URINE: NEGATIVE
GLUCOSE, UA: NEGATIVE mg/dL
Hgb urine dipstick: NEGATIVE
KETONES UR: NEGATIVE mg/dL
LEUKOCYTES UA: NEGATIVE
Nitrite: NEGATIVE
Protein, ur: NEGATIVE mg/dL
Urobilinogen, UA: 1 mg/dL (ref 0.0–1.0)
pH: 6 (ref 5.0–8.0)

## 2014-12-21 LAB — CBC
HCT: 32.6 % — ABNORMAL LOW (ref 36.0–46.0)
Hemoglobin: 11.6 g/dL — ABNORMAL LOW (ref 12.0–15.0)
MCH: 33.6 pg (ref 26.0–34.0)
MCHC: 35.6 g/dL (ref 30.0–36.0)
MCV: 94.5 fL (ref 78.0–100.0)
Platelets: 130 10*3/uL — ABNORMAL LOW (ref 150–400)
RBC: 3.45 MIL/uL — AB (ref 3.87–5.11)
RDW: 13.2 % (ref 11.5–15.5)
WBC: 7.9 10*3/uL (ref 4.0–10.5)

## 2014-12-21 MED ORDER — RANITIDINE HCL 150 MG PO TABS: 150.0000 mg | ORAL_TABLET | Freq: Two times a day (BID) | ORAL | Status: AC

## 2014-12-21 NOTE — MAU Provider Note (Signed)
History   19 yo G1P0 at 64 4/7 weeks presented unannounced c/o mid-abdominal pain since around 0230.  Denies N/V, diarrhea/constipation, back pain, dysuria, fever, viral exposures, uterine cramping, discharge, or bleeding. Has been "very gassy", with frequent flatus and burping. Did eat greasy food night before last (not usual diet).  Has struggled with N/V during pregnancy, but has been doing well over the last few weeks.  Reports normal appetite over last few days.  Patient Active Problem List   Diagnosis Date Noted  . Pregnancy 08/23/2014  . Rh negative state in antepartum period 08/23/2014    Chief Complaint  Patient presents with  . Abdominal Pain   HPI:  See above  OB History    Gravida Para Term Preterm AB TAB SAB Ectopic Multiple Living   1               Past Medical History  Diagnosis Date  . Depression   . Anxiety     Past Surgical History  Procedure Laterality Date  . No past surgeries      Family History  Problem Relation Age of Onset  . Alcohol abuse Father     inpt tx 2007  . Suicidality Father     2007 inpt tx    History  Substance Use Topics  . Smoking status: Former Smoker    Types: Cigarettes  . Smokeless tobacco: Never Used  . Alcohol Use: No    Allergies: No Known Allergies  Prescriptions prior to admission  Medication Sig Dispense Refill Last Dose  . calcium carbonate (TUMS - DOSED IN MG ELEMENTAL CALCIUM) 500 MG chewable tablet Chew 2 tablets by mouth daily.   12/21/2014 at Unknown time  . Prenatal Vit-Fe Fumarate-FA (PRENATAL MULTIVITAMIN) TABS tablet Take 1 tablet by mouth daily at 12 noon.   Past Month at Unknown time    ROS:  Mild mid-abdominal pain, +FM Physical Exam   Blood pressure 124/69, pulse 92, temperature 98.4 F (36.9 C), resp. rate 18, height  (1.651 m), weight 179 lb (81.194 kg), last menstrual period 07/08/2014, SpO2 100 %.  Results for orders placed or performed during the hospital encounter of 12/21/14 (from  the past 24 hour(s))  Urinalysis, Routine w reflex microscopic     Status: Abnormal   Collection Time: 12/21/14  5:05 AM  Result Value Ref Range   Color, Urine YELLOW YELLOW   APPearance CLEAR CLEAR   Specific Gravity, Urine >1.030 (H) 1.005 - 1.030   pH 6.0 5.0 - 8.0   Glucose, UA NEGATIVE NEGATIVE mg/dL   Hgb urine dipstick NEGATIVE NEGATIVE   Bilirubin Urine NEGATIVE NEGATIVE   Ketones, ur NEGATIVE NEGATIVE mg/dL   Protein, ur NEGATIVE NEGATIVE mg/dL   Urobilinogen, UA 1.0 0.0 - 1.0 mg/dL   Nitrite NEGATIVE NEGATIVE   Leukocytes, UA NEGATIVE NEGATIVE  CBC     Status: Abnormal   Collection Time: 12/21/14  5:39 AM  Result Value Ref Range   WBC 7.9 4.0 - 10.5 K/uL   RBC 3.45 (L) 3.87 - 5.11 MIL/uL   Hemoglobin 11.6 (L) 12.0 - 15.0 g/dL   HCT 16.1 (L) 09.6 - 04.5 %   MCV 94.5 78.0 - 100.0 fL   MCH 33.6 26.0 - 34.0 pg   MCHC 35.6 30.0 - 36.0 g/dL   RDW 40.9 81.1 - 91.4 %   Platelets 130 (L) 150 - 400 K/uL    Physical Exam  In NAD Chest clear Heart RRR without murmur Abd gravid,  NT.  No rebound or guarding. + bowel sounds in all quadrants. Pelvic--cervix closed, long.  No d/c in vault. Ext WNL  FHR 156 per doppler    ED Course  Assessment: IUP at 22 4/7 weeks Likely digestive issue  Plan: D/C home Rx Zantac Recommended OTC simethicone Dietary recommendations for bland diet Keep scheduled ROB appt 12/27/14. Call for any worsening of sx.   Nigel BridgemanLATHAM, Casha Estupinan CNM, MSN 12/21/2014 8:11 AM

## 2014-12-21 NOTE — Progress Notes (Signed)
Called Venus Standard CNM who is in delivery. Dr Stefano GaulSTringer took call and notified of pt's admission and status. CBC ordered and provider will see pt after delivery

## 2014-12-21 NOTE — Discharge Instructions (Signed)
Take gas-reducing medication (Gas-X, Phazyme, Mylanta Gas) per package directions. Keep diet bland for the next few days--no high-fat/greasy/spicy foods.  Gastroesophageal Reflux Disease, Adult Gastroesophageal reflux disease (GERD) happens when acid from your stomach flows up into the esophagus. When acid comes in contact with the esophagus, the acid causes soreness (inflammation) in the esophagus. Over time, GERD may create small holes (ulcers) in the lining of the esophagus. CAUSES   Increased body weight. This puts pressure on the stomach, making acid rise from the stomach into the esophagus.  Smoking. This increases acid production in the stomach.  Drinking alcohol. This causes decreased pressure in the lower esophageal sphincter (valve or ring of muscle between the esophagus and stomach), allowing acid from the stomach into the esophagus.  Late evening meals and a full stomach. This increases pressure and acid production in the stomach.  A malformed lower esophageal sphincter. Sometimes, no cause is found. SYMPTOMS   Burning pain in the lower part of the mid-chest behind the breastbone and in the mid-stomach area. This may occur twice a week or more often.  Trouble swallowing.  Sore throat.  Dry cough.  Asthma-like symptoms including chest tightness, shortness of breath, or wheezing. DIAGNOSIS  Your caregiver may be able to diagnose GERD based on your symptoms. In some cases, X-rays and other tests may be done to check for complications or to check the condition of your stomach and esophagus. TREATMENT  Your caregiver may recommend over-the-counter or prescription medicines to help decrease acid production. Ask your caregiver before starting or adding any new medicines.  HOME CARE INSTRUCTIONS   Change the factors that you can control. Ask your caregiver for guidance concerning weight loss, quitting smoking, and alcohol consumption.  Avoid foods and drinks that make your  symptoms worse, such as:  Caffeine or alcoholic drinks.  Chocolate.  Peppermint or mint flavorings.  Garlic and onions.  Spicy foods.  Citrus fruits, such as oranges, lemons, or limes.  Tomato-based foods such as sauce, chili, salsa, and pizza.  Fried and fatty foods.  Avoid lying down for the 3 hours prior to your bedtime or prior to taking a nap.  Eat small, frequent meals instead of large meals.  Wear loose-fitting clothing. Do not wear anything tight around your waist that causes pressure on your stomach.  Raise the head of your bed 6 to 8 inches with wood blocks to help you sleep. Extra pillows will not help.  Only take over-the-counter or prescription medicines for pain, discomfort, or fever as directed by your caregiver.  Do not take aspirin, ibuprofen, or other nonsteroidal anti-inflammatory drugs (NSAIDs). SEEK IMMEDIATE MEDICAL CARE IF:   You have pain in your arms, neck, jaw, teeth, or back.  Your pain increases or changes in intensity or duration.  You develop nausea, vomiting, or sweating (diaphoresis).  You develop shortness of breath, or you faint.  Your vomit is green, yellow, black, or looks like coffee grounds or blood.  Your stool is red, bloody, or black. These symptoms could be signs of other problems, such as heart disease, gastric bleeding, or esophageal bleeding. MAKE SURE YOU:   Understand these instructions.  Will watch your condition.  Will get help right away if you are not doing well or get worse. Document Released: 06/23/2005 Document Revised: 12/06/2011 Document Reviewed: 04/02/2011 [facility] Patient Information 2015 [place], [place]. This information is not intended to replace advice given to you by your health care provider. Make sure you discuss any questions you have  with your health care provider.

## 2014-12-21 NOTE — MAU Note (Addendum)
I think i have gas. Had indigestion all day yesterday. Pain is in lower abd and comes in sharp bursts. Denies vag bleeding or no LOF. Clear mucousy d/c. States burps smell like hard-boiled eggs

## 2015-03-20 LAB — OB RESULTS CONSOLE GBS: STREP GROUP B AG: NEGATIVE

## 2015-03-24 ENCOUNTER — Inpatient Hospital Stay (HOSPITAL_COMMUNITY)
Admission: AD | Admit: 2015-03-24 | Discharge: 2015-03-24 | Disposition: A | Discharge status: Home / Self Care | Payer: Medicaid Other | Source: Ambulatory Visit | Attending: Obstetrics & Gynecology | Admitting: Obstetrics & Gynecology

## 2015-03-24 DIAGNOSIS — O26899 Other specified pregnancy related conditions, unspecified trimester: Secondary | ICD-10-CM

## 2015-03-24 DIAGNOSIS — Z8659 Personal history of other mental and behavioral disorders: Secondary | ICD-10-CM

## 2015-03-24 DIAGNOSIS — Z3493 Encounter for supervision of normal pregnancy, unspecified, third trimester: Secondary | ICD-10-CM | POA: Insufficient documentation

## 2015-03-24 DIAGNOSIS — R109 Unspecified abdominal pain: Secondary | ICD-10-CM

## 2015-03-24 DIAGNOSIS — Z3A36 36 weeks gestation of pregnancy: Secondary | ICD-10-CM | POA: Insufficient documentation

## 2015-03-24 LAB — URINALYSIS, ROUTINE W REFLEX MICROSCOPIC
Bilirubin Urine: NEGATIVE
Glucose, UA: NEGATIVE mg/dL
Hgb urine dipstick: NEGATIVE
Ketones, ur: NEGATIVE mg/dL
LEUKOCYTES UA: NEGATIVE
NITRITE: NEGATIVE
PH: 6.5 (ref 5.0–8.0)
Protein, ur: 30 mg/dL — AB
SPECIFIC GRAVITY, URINE: 1.025 (ref 1.005–1.030)
UROBILINOGEN UA: 4 mg/dL — AB (ref 0.0–1.0)

## 2015-03-24 LAB — WET PREP, GENITAL
Clue Cells Wet Prep HPF POC: NONE SEEN
Trich, Wet Prep: NONE SEEN
Yeast Wet Prep HPF POC: NONE SEEN

## 2015-03-24 LAB — URINE MICROSCOPIC-ADD ON

## 2015-03-24 NOTE — MAU Note (Signed)
PT presents complaining of cramping that is "very intense." Denies vaginal bleeding or leaking of fluid. Reports good fetal movement.

## 2015-03-24 NOTE — MAU Provider Note (Signed)
History  19 yo G1P0 @ 36.0 wks presents unannounced to MAU w/ c/o painful cramping while at work, decreased since arrival. +FM. Denies VB or LOF.  Patient Active Problem List   Diagnosis Date Noted  . Cramping affecting pregnancy, antepartum 03/24/2015  . History of depression 03/24/2015  . History of anxiety 03/24/2015  . Rh negative state in antepartum period 08/23/2014    Chief Complaint  Patient presents with  . Abdominal Pain   HPI As above OB History    Gravida Para Term Preterm AB TAB SAB Ectopic Multiple Living   1               Past Medical History  Diagnosis Date  . Depression   . Anxiety     Past Surgical History  Procedure Laterality Date  . No past surgeries      Family History  Problem Relation Age of Onset  . Alcohol abuse Father     inpt tx 2007  . Suicidality Father     2007 inpt tx    History  Substance Use Topics  . Smoking status: Former Smoker    Types: Cigarettes  . Smokeless tobacco: Never Used  . Alcohol Use: No    Allergies: No Known Allergies  Prescriptions prior to admission  Medication Sig Dispense Refill Last Dose  . acetaminophen (TYLENOL) 500 MG tablet Take 500 mg by mouth every 6 (six) hours as needed for moderate pain or headache.   03/23/2015 at Unknown time  . Prenatal Vit-Fe Fumarate-FA (PRENATAL MULTIVITAMIN) TABS tablet Take 2 tablets by mouth daily at 12 noon.    Past Week at Unknown time  . ranitidine (ZANTAC) 150 MG tablet Take 1 tablet (150 mg total) by mouth 2 (two) times daily. (Patient taking differently: Take 150 mg by mouth 2 (two) times daily as needed for heartburn. ) 60 tablet 2 03/24/2015 at 1100  . sertraline (ZOLOFT) 50 MG tablet Take 50 mg by mouth daily.   Past Week at Unknown time    ROS  +FM +cramping -VB -LOF Physical Exam   Results for orders placed or performed during the hospital encounter of 03/24/15 (from the past 24 hour(s))  Urinalysis, Routine w reflex microscopic (not at [facility])      Status: Abnormal   Collection Time: 03/24/15  6:48 PM  Result Value Ref Range   Color, Urine YELLOW YELLOW   APPearance CLEAR CLEAR   Specific Gravity, Urine 1.025 1.005 - 1.030   pH 6.5 5.0 - 8.0   Glucose, UA NEGATIVE NEGATIVE mg/dL   Hgb urine dipstick NEGATIVE NEGATIVE   Bilirubin Urine NEGATIVE NEGATIVE   Ketones, ur NEGATIVE NEGATIVE mg/dL   Protein, ur 30 (A) NEGATIVE mg/dL   Urobilinogen, UA 4.0 (H) 0.0 - 1.0 mg/dL   Nitrite NEGATIVE NEGATIVE   Leukocytes, UA NEGATIVE NEGATIVE  Urine microscopic-add on     Status: Abnormal   Collection Time: 03/24/15  6:48 PM  Result Value Ref Range   Squamous Epithelial / LPF FEW (A) RARE   WBC, UA 0-2 <3 WBC/hpf   RBC / HPF 0-2 <3 RBC/hpf   Bacteria, UA FEW (A) RARE   Urine-Other MUCOUS PRESENT   Wet prep, genital     Status: Abnormal   Collection Time: 03/24/15  8:30 PM  Result Value Ref Range   Yeast Wet Prep HPF POC NONE SEEN NONE SEEN   Trich, Wet Prep NONE SEEN NONE SEEN   Clue Cells Wet Prep HPF POC NONE  SEEN NONE SEEN   WBC, Wet Prep HPF POC FEW (A) NONE SEEN   Blood pressure 117/70, pulse 75, temperature 98 F (36.7 C), resp. rate 18, height 5\' 5"  (1.651 m), weight 88.905 kg (196 lb), last menstrual period 07/08/2014.   Physical Exam Gen: NAD Abdomen: gravid, soft, NT, no guarding or rebound Speculum: Moderate amount of white discharge, no odor, sample collected for GC/CT and wet prep Pelvic: Cvx long/closed FHRT: Cat 1 U/Cs: None ED Course  UA Urine culture Wet prep GC/CT Assessment: PTCs likely brought on by increased activity at work -- resolved w/ rest in MAU. Lab results as above.  Plan: PTL precautions Work note OB f/u as previously scheduled   Sherre ScarletWILLIAMS, Judea Fennimore CNM, MS 03/24/2015 10:38 PM

## 2015-03-25 LAB — GC/CHLAMYDIA PROBE AMP (~~LOC~~) NOT AT ARMC
CHLAMYDIA, DNA PROBE: NEGATIVE
NEISSERIA GONORRHEA: NEGATIVE

## 2015-03-27 LAB — CULTURE, OB URINE

## 2015-04-17 ENCOUNTER — Telehealth (HOSPITAL_COMMUNITY): Payer: Self-pay | Admitting: *Deleted

## 2015-04-17 NOTE — Telephone Encounter (Signed)
Preadmission screen  

## 2015-04-21 ENCOUNTER — Inpatient Hospital Stay (HOSPITAL_COMMUNITY)
Admission: AD | Admit: 2015-04-21 | Discharge: 2015-04-23 | DRG: 775 | Disposition: A | Discharge status: Home / Self Care | Payer: Medicaid Other | Source: Ambulatory Visit | Attending: Obstetrics and Gynecology | Admitting: Obstetrics and Gynecology

## 2015-04-21 ENCOUNTER — Encounter (HOSPITAL_COMMUNITY): Payer: Self-pay

## 2015-04-21 DIAGNOSIS — D649 Anemia, unspecified: Secondary | ICD-10-CM | POA: Diagnosis not present

## 2015-04-21 DIAGNOSIS — O99284 Endocrine, nutritional and metabolic diseases complicating childbirth: Secondary | ICD-10-CM | POA: Diagnosis present

## 2015-04-21 DIAGNOSIS — Z87891 Personal history of nicotine dependence: Secondary | ICD-10-CM

## 2015-04-21 DIAGNOSIS — O133 Gestational [pregnancy-induced] hypertension without significant proteinuria, third trimester: Secondary | ICD-10-CM | POA: Diagnosis present

## 2015-04-21 DIAGNOSIS — O139 Gestational [pregnancy-induced] hypertension without significant proteinuria, unspecified trimester: Secondary | ICD-10-CM | POA: Diagnosis present

## 2015-04-21 DIAGNOSIS — Z3A40 40 weeks gestation of pregnancy: Secondary | ICD-10-CM | POA: Diagnosis present

## 2015-04-21 DIAGNOSIS — E876 Hypokalemia: Secondary | ICD-10-CM | POA: Diagnosis present

## 2015-04-21 DIAGNOSIS — O9081 Anemia of the puerperium: Secondary | ICD-10-CM | POA: Diagnosis present

## 2015-04-21 LAB — COMPREHENSIVE METABOLIC PANEL
ALT: 12 U/L — ABNORMAL LOW (ref 14–54)
AST: 20 U/L (ref 15–41)
Albumin: 3.4 g/dL — ABNORMAL LOW (ref 3.5–5.0)
Alkaline Phosphatase: 118 U/L (ref 38–126)
Anion gap: 5 (ref 5–15)
CO2: 23 mmol/L (ref 22–32)
CREATININE: 0.56 mg/dL (ref 0.44–1.00)
Calcium: 8.6 mg/dL — ABNORMAL LOW (ref 8.9–10.3)
Chloride: 108 mmol/L (ref 101–111)
GFR calc non Af Amer: >60 mL/min (ref >60)
GLUCOSE: 119 mg/dL — AB (ref 65–99)
POTASSIUM: 2.7 mmol/L — AB (ref 3.5–5.1)
SODIUM: 136 mmol/L (ref 135–145)
TOTAL PROTEIN: 6.4 g/dL — AB (ref 6.5–8.1)
Total Bilirubin: 0.7 mg/dL (ref 0.3–1.2)

## 2015-04-21 LAB — LACTATE DEHYDROGENASE: LDH: 153 U/L (ref 98–192)

## 2015-04-21 LAB — CBC
HCT: 32.6 % — ABNORMAL LOW (ref 36.0–46.0)
Hemoglobin: 11.4 g/dL — ABNORMAL LOW (ref 12.0–15.0)
MCH: 32.6 pg (ref 26.0–34.0)
MCHC: 35 g/dL (ref 30.0–36.0)
MCV: 93.1 fL (ref 78.0–100.0)
PLATELETS: 155 10*3/uL (ref 150–400)
RBC: 3.5 MIL/uL — AB (ref 3.87–5.11)
RDW: 12.6 % (ref 11.5–15.5)
WBC: 8.3 10*3/uL (ref 4.0–10.5)

## 2015-04-21 LAB — PROTEIN / CREATININE RATIO, URINE
CREATININE, URINE: 171 mg/dL
Protein Creatinine Ratio: 0.13 mg/mg{Cre} (ref 0.00–0.15)
Total Protein, Urine: 23 mg/dL

## 2015-04-21 LAB — URIC ACID: Uric Acid, Serum: 3.9 mg/dL (ref 2.3–6.6)

## 2015-04-21 MED ORDER — LIDOCAINE HCL (PF) 1 % IJ SOLN
30.0000 mL | INTRAMUSCULAR | Status: DC | PRN
  Administered 2015-04-22: 30 mL via SUBCUTANEOUS
  Filled 2015-04-21: qty 30

## 2015-04-21 MED ORDER — HYDRALAZINE HCL 20 MG/ML IJ SOLN: 10.0000 mg | Freq: Once | INTRAMUSCULAR | Status: AC | PRN

## 2015-04-21 MED ORDER — NALBUPHINE HCL 10 MG/ML IJ SOLN
10.0000 mg | INTRAMUSCULAR | Status: DC | PRN
  Administered 2015-04-22: 10 mg via INTRAVENOUS
  Filled 2015-04-21: qty 1

## 2015-04-21 MED ORDER — OXYTOCIN BOLUS FROM INFUSION: 500.0000 mL | INTRAVENOUS | Status: DC

## 2015-04-21 MED ORDER — CITRIC ACID-SODIUM CITRATE 334-500 MG/5ML PO SOLN: 30.0000 mL | ORAL | Status: DC | PRN

## 2015-04-21 MED ORDER — OXYTOCIN 40 UNITS IN LACTATED RINGERS INFUSION - SIMPLE MED
1.0000 m[IU]/min | INTRAVENOUS | Status: DC
  Administered 2015-04-21: 1 m[IU]/min via INTRAVENOUS
  Filled 2015-04-21: qty 1000

## 2015-04-21 MED ORDER — OXYTOCIN 40 UNITS IN LACTATED RINGERS INFUSION - SIMPLE MED
62.5000 mL/h | INTRAVENOUS | Status: DC
  Administered 2015-04-22: 62.5 mL/h via INTRAVENOUS
  Filled 2015-04-21: qty 1000

## 2015-04-21 MED ORDER — LACTATED RINGERS IV SOLN
INTRAVENOUS | Status: DC
  Administered 2015-04-21 – 2015-04-22 (×2): via INTRAVENOUS

## 2015-04-21 MED ORDER — OXYCODONE-ACETAMINOPHEN 5-325 MG PO TABS: 1.0000 | ORAL_TABLET | ORAL | Status: DC | PRN

## 2015-04-21 MED ORDER — FLEET ENEMA 7-19 GM/118ML RE ENEM: 1.0000 | ENEMA | RECTAL | Status: DC | PRN

## 2015-04-21 MED ORDER — OXYCODONE-ACETAMINOPHEN 5-325 MG PO TABS: 2.0000 | ORAL_TABLET | ORAL | Status: DC | PRN

## 2015-04-21 MED ORDER — ONDANSETRON HCL 4 MG/2ML IJ SOLN
4.0000 mg | Freq: Four times a day (QID) | INTRAMUSCULAR | Status: DC | PRN
  Administered 2015-04-22: 4 mg via INTRAVENOUS
  Filled 2015-04-21: qty 2

## 2015-04-21 MED ORDER — TERBUTALINE SULFATE 1 MG/ML IJ SOLN: 0.2500 mg | Freq: Once | INTRAMUSCULAR | Status: AC | PRN

## 2015-04-21 MED ORDER — POTASSIUM CHLORIDE 10 MEQ/100ML IV SOLN
10.0000 meq | INTRAVENOUS | Status: AC
  Administered 2015-04-21 (×2): 10 meq via INTRAVENOUS
  Filled 2015-04-21 (×2): qty 100

## 2015-04-21 MED ORDER — LABETALOL HCL 5 MG/ML IV SOLN: 20.0000 mg | INTRAVENOUS | Status: DC | PRN

## 2015-04-21 MED ORDER — LACTATED RINGERS IV SOLN
500.0000 mL | INTRAVENOUS | Status: DC | PRN
  Administered 2015-04-22: 1000 mL via INTRAVENOUS

## 2015-04-21 MED ORDER — ACETAMINOPHEN 325 MG PO TABS: 650.0000 mg | ORAL_TABLET | ORAL | Status: DC | PRN

## 2015-04-21 NOTE — H&P (Signed)
Judith Osborn is a 19 y.o. female, G1 P0 at 40.0 weeks  Patient Active Problem List   Diagnosis Date Noted  . Cramping affecting pregnancy, antepartum 03/24/2015  . History of depression 03/24/2015  . History of anxiety 03/24/2015  . Rh negative state in antepartum period 08/23/2014    Pregnancy Course: Patient entered care at 11.4 weeks.   EDC of 04/21/15 was established by [place].      [place] evaluations:   12.1 weeks - Dating: FHR 155, anterverted uterus,  19.1 weeks - Anatomy: EFW 10oz - 55%, FHR 150, vertex, anterior, no previa,       31.4 weeks - FU: AFI 16.5, vertex, anterior placenta, anatomy complete   Significant prenatal events:  Rh negative, PIH, vitamin D deficiency - 17 at NOB,  PCR 0.12 on 04/15/15  Last evaluation:   40.0 weeks    Reason for admission:  PIH  Pt States:   Contractions Frequency: none         Contraction severity: n/a         Fetal activity: +FM  OB History    Gravida Para Term Preterm AB TAB SAB Ectopic Multiple Living   1              Past Medical History  Diagnosis Date  . Depression   . Anxiety    Past Surgical History  Procedure Laterality Date  . No past surgeries     Family History: family history includes Alcohol abuse in her father; Suicidality in her father. Social History:  reports that she has quit smoking. Her smoking use included Cigarettes. She has never used smokeless tobacco. She reports that she does not drink alcohol or use illicit drugs.   Prenatal Transfer Tool  Maternal Diabetes: No Genetic Screening: Normal Maternal Ultrasounds/Referrals: Normal Fetal Ultrasounds or other Referrals:  None Maternal Substance Abuse:  No Significant Maternal Medications:  None Significant Maternal Lab Results: Lab values include: Rh negative, Other: Antibody positive Anti D   ROS:  See HPI above, all other systems are negative  No Known Allergies    Last menstrual period 07/08/2014.  Maternal Exam:  Uterine Assessment:  Contraction frequency is rare.  Abdomen: Gravid, non tender. Fundal height is aga.  Normal external genitalia, vulva, cervix, uterus and adnexa.  No lesions noted on exam.  Pelvis adequate for delivery.  Fetal presentation: Vertex by Leopold's   Fetal Exam:  Monitor Surveillance : Continuous Monitoring Mode: Ultrasound.  NICHD: Category CTXs: none EFW   7.5 lbs  Physical Exam: Nursing note and vitals reviewed General: alert and cooperative She appears well nourished Psychiatric: Normal mood and affect. Her behavior is normal Head: Normocephalic Eyes: Pupils are equal, round, and reactive to light Neck: Normal range of motion Cardiovascular: RRR without murmur  Respiratory: CTAB. Effort normal  Abd: soft, non-tender, +BS, no rebound, no guarding  Genitourinary: Vagina normal  Neurological: A&Ox3 Skin: Warm and dry  Musculoskeletal: Normal range of motion  Homan's sign negative bilaterally No evidence of DVTs.  Edema: Minimal bilaterally non-pitting edema DTR: 2+ Clonus: None   Prenatal labs: ABO, Rh: --/--/A NEG (11/27 1757) Antibody: NEG (11/27 1757) Rubella:   Non immune RPR: Nonreactive (11/27 0000)  HBsAg: Negative (11/27 0000)  HIV: Non-reactive (11/27 0000)  GBS: Negative (06/23 0000) Sickle cell/Hgb electrophoresis:  WNL Pap:   GC:   negative Chlamydia: negative Genetic screenings:   Glucola:  negative  Assessment:  IUP at 40.0 weeks NICHD: Category Membranes:intact Bishop Score: 0  GBS negative Diagnosis: PIH  Plan:  Admit to L&D for IOL d/t PIH IOL options reviewed with patient including cytotec, foley bulb, AROM, and pitocin R&B of IOL reviewed including serial induction, failure, and/or CS requirement Pt and family verbalize understanding and agree with treatment plan.  Start induction with cytotec Okay to ambulate around unit with wireless monitors  Okay to get up and shower without monitoring  Regular diet prior to starting  pitocin Clear/Thin diet after pitocin starts  Will place next dose cytotec or foley bulb at   Continue with labor mgmt as ordered  IV pain medication per orders PRN Epidural per patient request Foley cath after patient is comfortable with epidural  Anticipate SVD   Attending MD available at all times.    All information will be verified at admission   Evangelyne Loja, CNM, MSN 04/21/2015, 6:19 PM

## 2015-04-22 ENCOUNTER — Inpatient Hospital Stay (HOSPITAL_COMMUNITY): Payer: Medicaid Other | Admitting: Anesthesiology

## 2015-04-22 ENCOUNTER — Encounter (HOSPITAL_COMMUNITY): Payer: Self-pay | Admitting: Anesthesiology

## 2015-04-22 LAB — CBC
HCT: 28.4 % — ABNORMAL LOW (ref 36.0–46.0)
HEMATOCRIT: 31.9 % — AB (ref 36.0–46.0)
HEMOGLOBIN: 10.1 g/dL — AB (ref 12.0–15.0)
Hemoglobin: 11.1 g/dL — ABNORMAL LOW (ref 12.0–15.0)
MCH: 32.6 pg (ref 26.0–34.0)
MCH: 33.3 pg (ref 26.0–34.0)
MCHC: 34.8 g/dL (ref 30.0–36.0)
MCHC: 35.6 g/dL (ref 30.0–36.0)
MCV: 93.8 fL (ref 78.0–100.0)
MCV: 93.7 fL (ref 78.0–100.0)
PLATELETS: 159 10*3/uL (ref 150–400)
Platelets: 137 10*3/uL — ABNORMAL LOW (ref 150–400)
RBC: 3.4 MIL/uL — ABNORMAL LOW (ref 3.87–5.11)
RBC: 3.03 MIL/uL — AB (ref 3.87–5.11)
RDW: 12.7 % (ref 11.5–15.5)
RDW: 12.6 % (ref 11.5–15.5)
WBC: 10.1 10*3/uL (ref 4.0–10.5)
WBC: 18.4 10*3/uL — ABNORMAL HIGH (ref 4.0–10.5)

## 2015-04-22 LAB — RPR: RPR Ser Ql: NONREACTIVE

## 2015-04-22 MED ORDER — MISOPROSTOL 200 MCG PO TABS
ORAL_TABLET | ORAL | Status: AC
  Filled 2015-04-22: qty 5

## 2015-04-22 MED ORDER — HYDRALAZINE HCL 20 MG/ML IJ SOLN: 10.0000 mg | Freq: Once | INTRAMUSCULAR | Status: AC | PRN

## 2015-04-22 MED ORDER — DIBUCAINE 1 % RE OINT: 1.0000 "application " | TOPICAL_OINTMENT | RECTAL | Status: DC | PRN

## 2015-04-22 MED ORDER — ONDANSETRON HCL 4 MG PO TABS: 4.0000 mg | ORAL_TABLET | ORAL | Status: DC | PRN

## 2015-04-22 MED ORDER — SIMETHICONE 80 MG PO CHEW: 80.0000 mg | CHEWABLE_TABLET | ORAL | Status: DC | PRN

## 2015-04-22 MED ORDER — MEASLES, MUMPS & RUBELLA VAC ~~LOC~~ INJ
0.5000 mL | INJECTION | Freq: Once | SUBCUTANEOUS | Status: DC
  Filled 2015-04-22: qty 0.5

## 2015-04-22 MED ORDER — LANOLIN HYDROUS EX OINT: TOPICAL_OINTMENT | CUTANEOUS | Status: DC | PRN

## 2015-04-22 MED ORDER — OXYCODONE-ACETAMINOPHEN 5-325 MG PO TABS: 1.0000 | ORAL_TABLET | ORAL | Status: DC | PRN

## 2015-04-22 MED ORDER — IBUPROFEN 600 MG PO TABS
600.0000 mg | ORAL_TABLET | Freq: Four times a day (QID) | ORAL | Status: DC
  Administered 2015-04-22 – 2015-04-23 (×6): 600 mg via ORAL
  Filled 2015-04-22 (×6): qty 1

## 2015-04-22 MED ORDER — ONDANSETRON HCL 4 MG/2ML IJ SOLN: 4.0000 mg | INTRAMUSCULAR | Status: DC | PRN

## 2015-04-22 MED ORDER — WITCH HAZEL-GLYCERIN EX PADS
1.0000 "application " | MEDICATED_PAD | CUTANEOUS | Status: DC | PRN
  Administered 2015-04-22: 1 via TOPICAL

## 2015-04-22 MED ORDER — OXYCODONE-ACETAMINOPHEN 5-325 MG PO TABS: 2.0000 | ORAL_TABLET | ORAL | Status: DC | PRN

## 2015-04-22 MED ORDER — EPHEDRINE 5 MG/ML INJ
10.0000 mg | INTRAVENOUS | Status: DC | PRN
  Filled 2015-04-22: qty 2

## 2015-04-22 MED ORDER — PRENATAL MULTIVITAMIN CH
1.0000 | ORAL_TABLET | Freq: Every day | ORAL | Status: DC
  Administered 2015-04-22 – 2015-04-23 (×2): 1 via ORAL
  Filled 2015-04-22 (×2): qty 1

## 2015-04-22 MED ORDER — DIPHENHYDRAMINE HCL 25 MG PO CAPS: 25.0000 mg | ORAL_CAPSULE | Freq: Four times a day (QID) | ORAL | Status: DC | PRN

## 2015-04-22 MED ORDER — TETANUS-DIPHTH-ACELL PERTUSSIS 5-2.5-18.5 LF-MCG/0.5 IM SUSP: 0.5000 mL | Freq: Once | INTRAMUSCULAR | Status: DC

## 2015-04-22 MED ORDER — FENTANYL 2.5 MCG/ML BUPIVACAINE 1/10 % EPIDURAL INFUSION (WH - ANES)
14.0000 mL/h | INTRAMUSCULAR | Status: DC | PRN
  Administered 2015-04-22: 14 mL/h via EPIDURAL
  Filled 2015-04-22: qty 125

## 2015-04-22 MED ORDER — PHENYLEPHRINE 40 MCG/ML (10ML) SYRINGE FOR IV PUSH (FOR BLOOD PRESSURE SUPPORT)
80.0000 ug | PREFILLED_SYRINGE | INTRAVENOUS | Status: DC | PRN
  Filled 2015-04-22: qty 20
  Filled 2015-04-22: qty 2

## 2015-04-22 MED ORDER — SENNOSIDES-DOCUSATE SODIUM 8.6-50 MG PO TABS
2.0000 | ORAL_TABLET | ORAL | Status: DC
  Administered 2015-04-23: 2 via ORAL
  Filled 2015-04-22: qty 2

## 2015-04-22 MED ORDER — DIPHENHYDRAMINE HCL 50 MG/ML IJ SOLN: 12.5000 mg | INTRAMUSCULAR | Status: DC | PRN

## 2015-04-22 MED ORDER — LIDOCAINE HCL (PF) 1 % IJ SOLN
INTRAMUSCULAR | Status: DC | PRN
  Administered 2015-04-22 (×2): 8 mL via EPIDURAL

## 2015-04-22 MED ORDER — ZOLPIDEM TARTRATE 5 MG PO TABS: 5.0000 mg | ORAL_TABLET | Freq: Every evening | ORAL | Status: DC | PRN

## 2015-04-22 MED ORDER — BENZOCAINE-MENTHOL 20-0.5 % EX AERO
1.0000 "application " | INHALATION_SPRAY | CUTANEOUS | Status: DC | PRN
  Administered 2015-04-22: 1 via TOPICAL
  Filled 2015-04-22: qty 56

## 2015-04-22 MED ORDER — MEDROXYPROGESTERONE ACETATE 150 MG/ML IM SUSP: 150.0000 mg | INTRAMUSCULAR | Status: DC | PRN

## 2015-04-22 MED ORDER — LABETALOL HCL 5 MG/ML IV SOLN: 20.0000 mg | INTRAVENOUS | Status: DC | PRN

## 2015-04-22 MED ORDER — MISOPROSTOL 200 MCG PO TABS: 1000.0000 ug | ORAL_TABLET | Freq: Once | ORAL | Status: DC

## 2015-04-22 MED ORDER — ACETAMINOPHEN 325 MG PO TABS: 650.0000 mg | ORAL_TABLET | ORAL | Status: DC | PRN

## 2015-04-22 NOTE — Progress Notes (Signed)
  Subjective: Sleeping, woke up and water broke. Desires IV pain medication.  Objective: BP 134/88 mmHg  Pulse 85  Temp(Src) 98.5 F (36.9 C) (Oral)  Resp 16  Ht  (1.651 m)  Wt 91.173 kg (201 lb)  BMI 33.45 kg/m2  LMP 07/08/2014 (Approximate)     Filed Vitals:   04/22/15 0301  BP: 134/88  Pulse: 85  Temp:   Resp: 16   FHT: Category 1 UC:   irregular, every 1-2 minutes SVE:   Dilation: 4 Effacement (%): 80 Station: -1 Exam by:: TWillis RNC Pitocin at 21 mU/min SROM at 04:42 AM, clear fluid  Assessment:  Progressive labor Cat 1 FHRT  Plan: IV pain medication Continue induction Consult prn  Sherre Scarlet CNM 04/22/2015, 4:57 AM

## 2015-04-22 NOTE — Lactation Note (Signed)
This note was copied from the chart of [facility]. Lactation Consultation Note Initial visit at 7 hours of age.  Baby's DR. Finishing visit at bedside.  Baby placed STS in cross cradle hold and was to sleepy to latch.  Mom has flat nipples with compressible tissue, and easily expressed colostrum.  [facility] resources given and discussed.  Encouraged to feed with early cues on demand.  Early newborn behavior discussed.   Mom to call for assist as needed.      Patient Name: Judith Osborn [id number] Date: 04/22/2015 Reason for consult: Initial assessment   Maternal Data Has patient been taught Hand Expression?: Yes Does the patient have breastfeeding experience prior to this delivery?: No  Feeding Feeding Type: Breast Fed  LATCH Score/Interventions Latch: Too sleepy or reluctant, no latch achieved, no sucking elicited.  Audible Swallowing: None  Type of Nipple: Flat Intervention(s): No intervention needed  Comfort (Breast/Nipple): Soft / non-tender     Hold (Positioning): Assistance needed to correctly position infant at breast and maintain latch. Intervention(s): Breastfeeding basics reviewed;Support Pillows;Position options;Skin to skin  LATCH Score: 4  Lactation Tools Discussed/Used WIC Program: No   Consult Status Consult Status: Follow-up Date: 04/23/15 Follow-up type: In-patient    Jannifer Rodney 04/22/2015, 6:46 PM

## 2015-04-22 NOTE — Anesthesia Preprocedure Evaluation (Signed)
Anesthesia Evaluation  Patient identified by MRN, date of birth, ID band Patient awake    Reviewed: Allergy & Precautions, H&P , NPO status , Patient's Chart, lab work & pertinent test results  Airway Mallampati: II  TM Distance: >3 FB Neck ROM: full    Dental no notable dental hx.    Pulmonary former smoker,  breath sounds clear to auscultation  Pulmonary exam normal       Cardiovascular negative cardio ROS  Rhythm:regular Rate:Normal     Neuro/Psych negative neurological ROS     GI/Hepatic negative GI ROS, Neg liver ROS,   Endo/Other  negative endocrine ROS  Renal/GU negative Renal ROS     Musculoskeletal   Abdominal (+) + obese,   Peds  Hematology negative hematology ROS (+)   Anesthesia Other Findings   Reproductive/Obstetrics (+) Pregnancy                             Anesthesia Physical Anesthesia Plan  ASA: II  Anesthesia Plan: Epidural   Post-op Pain Management:    Induction:   Airway Management Planned:   Additional Equipment:   Intra-op Plan:   Post-operative Plan:   Informed Consent: I have reviewed the patients History and Physical, chart, labs and discussed the procedure including the risks, benefits and alternatives for the proposed anesthesia with the patient or authorized representative who has indicated his/her understanding and acceptance.     Plan Discussed with:   Anesthesia Plan Comments:         Anesthesia Quick Evaluation

## 2015-04-22 NOTE — Anesthesia Procedure Notes (Signed)
Epidural Patient location during procedure: OB Start time: 04/22/2015 7:59 AM End time: 04/22/2015 8:03 AM  Staffing Anesthesiologist: Leilani Able Performed by: anesthesiologist   Preanesthetic Checklist Completed: patient identified, surgical consent, pre-op evaluation, timeout performed, IV checked, risks and benefits discussed and monitors and equipment checked  Epidural Patient position: sitting Prep: site prepped and draped and DuraPrep Patient monitoring: continuous pulse ox and blood pressure Approach: midline Location: L3-L4 Injection technique: LOR air  Needle:  Needle type: Tuohy  Needle gauge: 17 G Needle length: 9 cm and 9 Needle insertion depth: 6 cm Catheter type: closed end flexible Catheter size: 19 Gauge Catheter at skin depth: 11 cm Test dose: negative and Other  Assessment Sensory level: T9 Events: blood not aspirated, injection not painful, no injection resistance, negative IV test and no paresthesia

## 2015-04-22 NOTE — Progress Notes (Signed)
Subjective: Denies h/a, visual changes, epigastric pain or difficulty breathing. Has not required antihypertensive medication.  Objective: BP 140/99 mmHg  Pulse 87  Temp(Src) 98.5 F (36.9 C) (Oral)  Resp 20  Ht 5\' 5"  (1.651 m)  Wt 91.173 kg (201 lb)  BMI 33.45 kg/m2  LMP 07/08/2014 (Approximate)     Today's Vitals   04/21/15 2330 04/22/15 0001 04/22/15 0031 04/22/15 0045  BP: 135/90 143/94 140/99   Pulse: 99 90 87   Temp:  98.5 F (36.9 C)    TempSrc:  Oral    Resp:  18 20   Height:      Weight:      PainSc:    1    Results for orders placed or performed during the hospital encounter of 04/21/15 (from the past 24 hour(s))  CBC     Status: Abnormal   Collection Time: 04/21/15  8:00 PM  Result Value Ref Range   WBC 8.3 4.0 - 10.5 K/uL   RBC 3.50 (L) 3.87 - 5.11 MIL/uL   Hemoglobin 11.4 (L) 12.0 - 15.0 g/dL   HCT 16.1 (L) 09.6 - 04.5 %   MCV 93.1 78.0 - 100.0 fL   MCH 32.6 26.0 - 34.0 pg   MCHC 35.0 30.0 - 36.0 g/dL   RDW 40.9 81.1 - 91.4 %   Platelets 155 150 - 400 K/uL  Type and screen     Status: None (Preliminary result)   Collection Time: 04/21/15  8:00 PM  Result Value Ref Range   ABO/RH(D) A NEG    Antibody Screen POS    Sample Expiration 04/24/2015    Antibody Identification PASSIVELY ACQUIRED ANTI-D    DAT, IgG NEG    Unit Number [id number]    Blood Component Type RED CELLS,LR    Unit division 00    Status of Unit ALLOCATED    Transfusion Status OK TO TRANSFUSE    Crossmatch Result COMPATIBLE    Unit Number [id number]    Blood Component Type RED CELLS,LR    Unit division 00    Status of Unit ALLOCATED    Transfusion Status OK TO TRANSFUSE    Crossmatch Result COMPATIBLE   Comprehensive metabolic panel     Status: Abnormal   Collection Time: 04/21/15  8:00 PM  Result Value Ref Range   Sodium 136 135 - 145 mmol/L   Potassium 2.7 (LL) 3.5 - 5.1 mmol/L   Chloride 108 101 - 111 mmol/L   CO2 23 22 - 32 mmol/L   Glucose, Bld 119 (H) 65 - 99  mg/dL   BUN <5 (L) 6 - 20 mg/dL   Creatinine, Ser 3.24 0.44 - 1.00 mg/dL   Calcium 8.6 (L) 8.9 - 10.3 mg/dL   Total Protein 6.4 (L) 6.5 - 8.1 g/dL   Albumin 3.4 (L) 3.5 - 5.0 g/dL   AST 20 15 - 41 U/L   ALT 12 (L) 14 - 54 U/L   Alkaline Phosphatase 118 38 - 126 U/L   Total Bilirubin 0.7 0.3 - 1.2 mg/dL   GFR calc non Af Amer >60 >60 mL/min   GFR calc Af Amer >60 >60 mL/min   Anion gap 5 5 - 15  Lactate dehydrogenase     Status: None   Collection Time: 04/21/15  8:00 PM  Result Value Ref Range   LDH 153 98 - 192 U/L  Uric acid     Status: None   Collection Time: 04/21/15  8:00 PM  Result Value  Ref Range   Uric Acid, Serum 3.9 2.3 - 6.6 mg/dL  Protein / creatinine ratio, urine     Status: None   Collection Time: 04/21/15  9:20 PM  Result Value Ref Range   Creatinine, Urine 171.00 mg/dL   Total Protein, Urine 23 mg/dL   Protein Creatinine Ratio 0.13 0.00 - 0.15 mg/mg[Cre]   FHT: BL 130 w/ moderate variability, +accels -- some segments of fetal tachycardia -- resolved w/ IVFs  UC:   irregular, every 1-2 minutes SVE:   Dilation: 3 Effacement (%): 70 Station: -2 Exam by:: TWillis RNC Pitocin at 13 mU/min  Assessment:  IUP at 40.1 wks IOL due to Gestational HTN GBS neg Reassuring FHRT Hypokalemia  Plan: Continue induction Consult prn 20 mEq Potassium Chloride IV given ---10 mEq @ 2131 and 10 mEq @ 2238  Sherre Scarlet CNM 04/22/2015, 1:39 AM

## 2015-04-23 DIAGNOSIS — D649 Anemia, unspecified: Secondary | ICD-10-CM | POA: Diagnosis not present

## 2015-04-23 LAB — CBC
HCT: 23.4 % — ABNORMAL LOW (ref 36.0–46.0)
Hemoglobin: 8.2 g/dL — ABNORMAL LOW (ref 12.0–15.0)
MCH: 33.2 pg (ref 26.0–34.0)
MCHC: 35 g/dL (ref 30.0–36.0)
MCV: 94.7 fL (ref 78.0–100.0)
PLATELETS: 116 10*3/uL — AB (ref 150–400)
RBC: 2.47 MIL/uL — ABNORMAL LOW (ref 3.87–5.11)
RDW: 12.8 % (ref 11.5–15.5)
WBC: 8.6 10*3/uL (ref 4.0–10.5)

## 2015-04-23 MED ORDER — NORETHINDRONE 0.35 MG PO TABS: 1.0000 | ORAL_TABLET | Freq: Every day | ORAL | Status: AC

## 2015-04-23 MED ORDER — IBUPROFEN 600 MG PO TABS: 600.0000 mg | ORAL_TABLET | Freq: Four times a day (QID) | ORAL | Status: DC | PRN

## 2015-04-23 MED ORDER — FERROUS SULFATE 325 (65 FE) MG PO TABS: 325.0000 mg | ORAL_TABLET | Freq: Two times a day (BID) | ORAL | Status: AC

## 2015-04-23 MED ORDER — FERROUS SULFATE 325 (65 FE) MG PO TABS
325.0000 mg | ORAL_TABLET | Freq: Two times a day (BID) | ORAL | Status: DC
  Administered 2015-04-23 (×2): 325 mg via ORAL
  Filled 2015-04-23 (×2): qty 1

## 2015-04-23 NOTE — Discharge Summary (Signed)
  Vaginal Delivery Discharge Summary  Judith Osborn  DOB:    [birth year] MRN:    [id number] CSN:    [id number]  Date of admission:                  04/20/14  Date of discharge:                   04/23/15  Procedures this admission:   SVB, repair of 2nd degree perineal laceration  Date of Delivery: 04/21/15  Newborn Data:  Live born female  Birth Weight: 7 lb 10.1 oz (3460 g) APGAR: 7, 9  Home with mother. Name: Zollie Scale   History of Present Illness:  Judith Osborn is a 19 y.o. female, G1P1001, who presents at [redacted]w[redacted]d weeks gestation. The patient has been followed at [facility] and Gynecology division of [facility]. She was admitted for induction of labor for gestational hypertension. Her pregnancy has been complicated by:  Patient Active Problem List   Diagnosis Date Noted  . Anemia 04/23/2015  . Vaginal delivery 04/22/2015  . Gestational hypertension 04/21/2015  . Cramping affecting pregnancy, antepartum 03/24/2015  . History of depression 03/24/2015  . History of anxiety 03/24/2015  . Rh negative state in antepartum period 08/23/2014     Hospital Course:   Admitting Dx:  IUP at 40 weeks, gestational hypertension, RH negative GBS Status:  Negative Delivering Clinician:  Venus Standard, CNM Lacerations/MLE: 2nd degree perineal Complications: Anemia without hemodynamic instability  Intrapartum Procedures: spontaneous vaginal delivery Postpartum Procedures: Rho(D) Ig Complications-Operative and Postpartum: 2nd degree perineal laceration  Discharge Diagnoses: Term Pregnancy-delivered, gestational hypertension, anemia  Feeding:  breast  Contraception:  oral progesterone-only contraceptive  Hemoglobin Results:  CBC Latest Ref Rng 04/23/2015 04/22/2015 04/22/2015  WBC 4.0 - 10.5 K/uL 8.6 18.4(H) 10.1  Hemoglobin 12.0 - 15.0 g/dL 8.2(L) 10.1(L) 11.1(L)  Hematocrit 36.0 - 46.0 % 23.4(L) 28.4(L) 31.9(L)  Platelets 150 - 400  K/uL 116(L) 159 137(L)    Discharge Physical Exam:   General: alert Lochia: appropriate Uterine Fundus: firm Incision: healing well DVT Evaluation: No evidence of DVT seen on physical exam. Negative Homan's sign.   Discharge Information:  Activity:           pelvic rest Diet:                routine Medications: Ibuprofen and Iron Condition:      stable Instructions:  Routine pp instructions   Discharge to: home  Follow-up Information    Follow up with [facility]. Schedule an appointment as soon as possible for a visit in 6 weeks.   Specialty:  Obstetrics and Gynecology   Why:  Call with any questions or concerns.   Contact information:   [address] [place] [address] [phone]    [facility] RN to see patient early next week for BP check, with results to be called to [facility].   Nigel Bridgeman CNM 04/23/2015 7:44 PM

## 2015-04-23 NOTE — Clinical Social Work Maternal (Signed)
CLINICAL SOCIAL WORK MATERNAL/CHILD NOTE  Patient Details  Name: Judith Osborn MRN: [id number] Date of Birth: Dec 02, 1995  Date:  04/23/2015  Clinical Social Worker Initiating Note:  Loleta Books, LCSW Date/ Time Initiated:  04/23/15/1030     Child's Name:  Judith Osborn   Legal Guardian:  Mother- Lindwood Qua Wyrick   Need for Interpreter:  None   Date of Referral:  04/22/15     Reason for Referral:  Hx of anxiety and depression  Referral Source:  [facility]   Address:  [address] [place], [place] [address]  Phone number:  [phone]   Household Members:  Significant Other, Parents, Siblings   Natural Supports (not living in the home):  Immediate Family, Extended Family   Professional Supports: None   Employment: Full-time   Type of Work: Environmental manager:      Surveyor, quantity Resources:  [employer]   Other Resources:  Sales executive , WIC   Cultural/Religious Considerations Which May Impact Care:  None reported  Strengths:  Home prepared for child , Pediatrician chosen , Ability to meet basic needs    Risk Factors/Current Problems:  Mental Health Concerns: [name] presents with history of anxiety and depression since 2011.  [name] noted increase in symptoms during the pregnancy, but discussed belief that it was situational. [name] was prescribed Zoloft for one month during the pregnancy, but [name] discontinued use due to side effects and resolution of symptoms.    Cognitive State:  Able to Concentrate , Alert , Linear Thinking , Goal Oriented    Mood/Affect:  Bright , Interested , Happy , Calm    CSW Assessment:  CSW received request for consult due to [name] presenting with a history of anxiety and depression.  [name] provided consent for the FOB to remain in the room during the assessment. [name] was noted to be in a pleasant mood and displayed a full range in affect. She presented as easily engaged and receptive to the visit.  [name] was holding and breastfeeding the  infant during the entire visit. FOB presented as supportive as he asked follow up questions related to maternal mental health in the postpartum period.   [name] confirmed history of depression/anxiety since 2011. She stated that she noted an increase in symptoms during the pregnancy, and shared belief that it was related to work related stress. [name] shared that her OB prescribed her Zoloft, but that she discontinued the medication after 1 month since she did not like how she felt on the medication. She also reported that she was "feeling better" and did not believe that she needed medication.  [name] acknowledges that she presents with an increased risk for postpartum depression and anxiety due to prior history and symptoms during the pregnancy.  [name] and FOB asked questions related to range in symptoms, and [name] agreed to remain in contact with her medical provider if she notes symptoms in order to explore different medications.    [name] shared that she has positive support from her mother, and confirmed that the home is prepared for the infant.  She stated that her mother had a baby 2 years ago, and she believes that she is prepared since she has helped care for her sibling and has a realistic understanding of caring for an infant.  [name] discussed how she has loved her transition to motherhood, and continues to look forward to her new normal.    [name] denied questions ,concerns, or needs at this time. She agreed to  contact CSW if needs arise.   CSW Plan/Description:   1)Patient/Family Education : Perinatal mood and anxiety disorders 2)No Further Intervention Required/No Barriers to Discharge    Kelby Fam 04/23/2015, 3:34 PM

## 2015-04-23 NOTE — Progress Notes (Signed)
Judith Osborn  Post Partum Day 1:S/P SVD with 2nd Degree Laceration Subjective: Patient up ad lib, denies syncope or dizziness. Reports consuming regular diet without issues and denies N/V. Denies issues with urination and reports bleeding is "like a regular period."  Patient is breastfeeding.  Desires Micronor for postpartum contraception--other long term methods discussed.  Pain is being appropriately managed with use of motrin.  Objective: Filed Vitals:   04/22/15 1415 04/22/15 1515 04/22/15 1825 04/23/15 0529  BP: 131/76 129/78 136/84 101/58  Pulse: 87 89 97 70  Temp: 98.9 F (37.2 C) 98.6 F (37 C) 99.3 F (37.4 C) 98.3 F (36.8 C)  TempSrc: Oral Oral Oral Oral  Resp: Height:      Weight:      SpO2: 98% 98% 98%     Recent Labs  04/22/15 1302 04/23/15 0555  HGB 10.1* 8.2*  HCT 28.4* 23.4*    Physical Exam:  General: alert, cooperative and pale Mood/Affect: Appropriate Lungs: clear to auscultation, no wheezes, rales or rhonchi, symmetric air entry.  Heart: normal rate and regular rhythm. Breast: not examined. Abdomen:  + bowel sounds, NT Uterine Fundus: firm, U/-2 Lochia: appropriate Laceration: Not Examined Skin: Warm, Dry. DVT Evaluation: No evidence of DVT seen on physical exam. Calf/Ankle edema is present. +3 Pedial Edema  Assessment S/P Vaginal Delivery-Day 1 Normal Involution BreastFeeding Asymptomatic Anemia  Plan: Start Fe Supplementation Educated on long-term BC Methods Encouraged to elevate feet  Continue current care Dr. Sallyanne Havers to be updated on patient status   Judith Osborn, Joyice Faster, MSN, CNM 04/23/2015, 8:26 AM

## 2015-04-23 NOTE — Anesthesia Postprocedure Evaluation (Signed)
  Anesthesia Post-op Note  Anesthesia Post-op Note  Patient: Judith Osborn  Procedure(s) Performed: * No procedures listed *  Patient Location: PACU and Mother/Baby  Anesthesia Type:Epidural  Level of Consciousness: awake, alert  and oriented  Airway and Oxygen Therapy: Patient Spontanous Breathing  Post-op Pain: none  Post-op Assessment: Post-op Vital signs reviewed, Patient's Cardiovascular Status Stable, No headache, No backache, No residual numbness and No residual motor weakness  Post-op Vital Signs: Reviewed and stable  Complications: No apparent anesthesia complications

## 2015-04-23 NOTE — Discharge Instructions (Signed)
Postpartum Care After Vaginal Delivery °After you deliver your newborn (postpartum period), the usual stay in the hospital is 24-72 hours. If there were problems with your labor or delivery, or if you have other medical problems, you might be in the hospital longer.  °While you are in the hospital, you will receive help and instructions on how to care for yourself and your newborn during the postpartum period.  °While you are in the hospital: °· Be sure to tell your nurses if you have pain or discomfort, as well as where you feel the pain and what makes the pain worse. °· If you had an incision made near your vagina (episiotomy) or if you had some tearing during delivery, the nurses may put ice packs on your episiotomy or tear. The ice packs may help to reduce the pain and swelling. °· If you are breastfeeding, you may feel uncomfortable contractions of your uterus for a couple of weeks. This is normal. The contractions help your uterus get back to normal size. °· It is normal to have some bleeding after delivery. °· For the first 1-3 days after delivery, the flow is red and the amount may be similar to a period. °· It is common for the flow to start and stop. °· In the first few days, you may pass some small clots. Let your nurses know if you begin to pass large clots or your flow increases. °· Do not  flush blood clots down the toilet before having the nurse look at them. °· During the next 3-10 days after delivery, your flow should become more watery and pink or brown-tinged in color. °· Ten to fourteen days after delivery, your flow should be a small amount of yellowish-white discharge. °· The amount of your flow will decrease over the first few weeks after delivery. Your flow may stop in 6-8 weeks. Most women have had their flow stop by 12 weeks after delivery. °· You should change your sanitary pads frequently. °· Wash your hands thoroughly with soap and water for at least 20 seconds after changing pads, using  the toilet, or before holding or feeding your newborn. °· You should feel like you need to empty your bladder within the first 6-8 hours after delivery. °· In case you become weak, lightheaded, or faint, call your nurse before you get out of bed for the first time and before you take a shower for the first time. °· Within the first few days after delivery, your breasts may begin to feel tender and full. This is called engorgement. Breast tenderness usually goes away within 48-72 hours after engorgement occurs. You may also notice milk leaking from your breasts. If you are not breastfeeding, do not stimulate your breasts. Breast stimulation can make your breasts produce more milk. °· Spending as much time as possible with your newborn is very important. During this time, you and your newborn can feel close and get to know each other. Having your newborn stay in your room (rooming in) will help to strengthen the bond with your newborn.  It will give you time to get to know your newborn and become comfortable caring for your newborn. °· Your hormones change after delivery. Sometimes the hormone changes can temporarily cause you to feel sad or tearful. These feelings should not last more than a few days. If these feelings last longer than that, you should talk to your caregiver. °· If desired, talk to your caregiver about methods of family planning or contraception. °·   Talk to your caregiver about immunizations. Your caregiver may want you to have the following immunizations before leaving the hospital: °· Tetanus, diphtheria, and pertussis (Tdap) or tetanus and diphtheria (Td) immunization. It is very important that you and your family (including grandparents) or others caring for your newborn are up-to-date with the Tdap or Td immunizations. The Tdap or Td immunization can help protect your newborn from getting ill. °· Rubella immunization. °· Varicella (chickenpox) immunization. °· Influenza immunization. You should  receive this annual immunization if you did not receive the immunization during your pregnancy. °Document Released: 07/11/2007 Document Revised: 06/07/2012 Document Reviewed: 05/10/2012 °[employer]® Patient Information ©2015 [employer]. This information is not intended to replace advice given to you by your health care provider. Make sure you discuss any questions you have with your health care provider. ° °Iron Deficiency Anemia °Anemia is a condition in which there are less red blood cells or hemoglobin in the blood than normal. Hemoglobin is the part of red blood cells that carries oxygen. Iron deficiency anemia is anemia caused by too little iron. It is the most common type of anemia. It may leave you tired and short of breath. °CAUSES  °· Lack of iron in the diet. °· Poor absorption of iron, as seen with intestinal disorders. °· Intestinal bleeding. °· Heavy periods. °SIGNS AND SYMPTOMS  °Mild anemia may not be noticeable. Symptoms may include: °· Fatigue. °· Headache. °· Pale skin. °· Weakness. °· Tiredness. °· Shortness of breath. °· Dizziness. °· Cold hands and feet. °· Fast or irregular heartbeat. °DIAGNOSIS  °Diagnosis requires a thorough evaluation and physical exam by your health care provider. Blood tests are generally used to confirm iron deficiency anemia. Additional tests may be done to find the underlying cause of your anemia. These may include: °· Testing for blood in the stool (fecal occult blood test). °· A procedure to see inside the colon and rectum (colonoscopy). °· A procedure to see inside the esophagus and stomach (endoscopy). °TREATMENT  °Iron deficiency anemia is treated by correcting the cause of the deficiency. Treatment may involve: °· Adding iron-rich foods to your diet. °· Taking iron supplements. Pregnant or breastfeeding women need to take extra iron because their normal diet usually does not provide the required amount. °· Taking vitamins. Vitamin C improves the absorption of  iron. Your health care provider may recommend that you take your iron tablets with a glass of orange juice or vitamin C supplement. °· Medicines to make heavy menstrual flow lighter. °· Surgery. °HOME CARE INSTRUCTIONS  °· Take iron as directed by your health care provider. °· If you cannot tolerate taking iron supplements by mouth, talk to your health care provider about taking them through a vein (intravenously) or an injection into a muscle. °· For the best iron absorption, iron supplements should be taken on an empty stomach. If you cannot tolerate them on an empty stomach, you may need to take them with food. °· Do not drink milk or take antacids at the same time as your iron supplements. Milk and antacids may interfere with the absorption of iron. °· Iron supplements can cause constipation. Make sure to include fiber in your diet to prevent constipation. A stool softener may also be recommended. °· Take vitamins as directed by your health care provider. °· Eat a diet rich in iron. Foods high in iron include liver, lean beef, whole-grain bread, eggs, dried fruit, and dark green leafy vegetables. °SEEK IMMEDIATE MEDICAL CARE IF:  °· You   faint. If this happens, do not drive. Call your local emergency services (911 in [place] if no other help is available. °· You have chest pain. °· You feel nauseous or vomit. °· You have severe or increased shortness of breath with activity. °· You feel weak. °· You have a rapid heartbeat. °· You have unexplained sweating. °· You become light-headed when getting up from a chair or bed. °MAKE SURE YOU:  °· Understand these instructions. °· Will watch your condition. °· Will get help right away if you are not doing well or get worse. °Document Released: 09/10/2000 Document Revised: 09/18/2013 Document Reviewed: 05/21/2013 °ExitCare® Patient Information ©2015 [employer]. This information is not intended to replace advice given to you by your health care provider. Make sure you  discuss any questions you have with your health care provider. ° °Iron-Rich Diet °An iron-rich diet contains foods that are good sources of iron. Iron is an important mineral that helps your body produce hemoglobin. Hemoglobin is a protein in red blood cells that carries oxygen to the body's tissues. Sometimes, the iron level in your blood can be low. This may be caused by: °· A lack of iron in your diet. °· Blood loss. °· Times of growth, such as during pregnancy or during a child's growth and development. °Low levels of iron can cause a decrease in the number of red blood cells. This can result in iron deficiency anemia. Iron deficiency anemia symptoms include: °· Tiredness. °· Weakness. °· Irritability. °· Increased chance of infection. °Here are some recommendations for daily iron intake: °· Males older than 21 years of age need 8 mg of iron per day. °· Women ages 19 to 50 need 18 mg of iron per day. °· Pregnant women need 27 mg of iron per day, and women who are over 19 years of age and breastfeeding need 9 mg of iron per day. °· Women over the age of 50 need 8 mg of iron per day. °SOURCES OF IRON °There are 2 types of iron that are found in food: heme iron and nonheme iron. Heme iron is absorbed by the body better than nonheme iron. Heme iron is found in meat, poultry, and fish. Nonheme iron is found in grains, beans, and vegetables. °Heme Iron Sources °Food / Iron (mg) °· Chicken liver, 3 oz (85 g)/ 10 mg °· Beef liver, 3 oz (85 g)/ 5.5 mg °· Oysters, 3 oz (85 g)/ 8 mg °· Beef, 3 oz (85 g)/ 2 to 3 mg °· Shrimp, 3 oz (85 g)/ 2.8 mg °· Turkey, 3 oz (85 g)/ 2 mg °· Chicken, 3 oz (85 g) / 1 mg °· Fish (tuna, halibut), 3 oz (85 g)/ 1 mg °· Pork, 3 oz (85 g)/ 0.9 mg °Nonheme Iron Sources °Food / Iron (mg) °· Ready-to-eat breakfast cereal, iron-fortified / 3.9 to 7 mg °· Tofu, ½ cup / 3.4 mg °· Kidney beans, ½ cup / 2.6 mg °· Baked potato with skin / 2.7 mg °· Asparagus, ½ cup / 2.2 mg °· Avocado / 2 mg °· Dried  peaches, ½ cup / 1.6 mg °· Raisins, ½ cup / 1.5 mg °· Soy milk, 1 cup / 1.5 mg °· Whole-wheat bread, 1 slice / 1.2 mg °· Spinach, 1 cup / 0.8 mg °· Broccoli, ½ cup / 0.6 mg °IRON ABSORPTION °Certain foods can decrease the body's absorption of iron. Try to avoid these foods and beverages while eating meals with iron-containing foods: °· Coffee. °· Tea. °·   Fiber. °· Soy. °Foods containing vitamin C can help increase the amount of iron your body absorbs from iron sources, especially from nonheme sources. Eat foods with vitamin C along with iron-containing foods to increase your iron absorption. Foods that are high in vitamin C include many fruits and vegetables. Some good sources are: °· Fresh orange juice. °· Oranges. °· Strawberries. °· Mangoes. °· Grapefruit. °· Red bell peppers. °· Green bell peppers. °· Broccoli. °· Potatoes with skin. °· Tomato juice. °Document Released: 04/27/2005 Document Revised: 12/06/2011 Document Reviewed: 03/04/2011 °[employer]Care® Patient Information ©2015 [employer]. This information is not intended to replace advice given to you by your health care provider. Make sure you discuss any questions you have with your health care provider. ° °

## 2015-04-23 NOTE — Lactation Note (Signed)
This note was copied from the chart of [facility]. Lactation Consultation Note  Patient Name: Judith Osborn [id number] Date: 04/23/2015 Reason for consult: Follow-up assessment Assisted Mom with positioning and obtaining depth with latch. Mom is considering pumping with some feedings instead of latching to let FOB feed baby. Discussed risk of using bottle early to BF success. Encouraged Mom to keep baby at the breast each feeding and not to start bottles till baby is 20-72 weeks old to establish her milk supply well and prevent nipple confusion.  Did give Mom Harmony Hand pump per her request and demonstrated use/cleaning if needed for engorgement. Basic teaching reviewed with Mom.  Advised baby should be at the breast 8-12 times in 24 hours and with feeding ques, cluster feeding reviewed. Engorgement care reviewed if needed. Advised of OP services and support group.   Maternal Data    Feeding Feeding Type: Breast Fed Length of feed: 10 min  LATCH Score/Interventions Latch: Grasps breast easily, tongue down, lips flanged, rhythmical sucking. Intervention(s): Adjust position;Assist with latch;Breast massage;Breast compression  Audible Swallowing: A few with stimulation  Type of Nipple: Everted at rest and after stimulation  Comfort (Breast/Nipple): Soft / non-tender     Hold (Positioning): Assistance needed to correctly position infant at breast and maintain latch. Intervention(s): Breastfeeding basics reviewed;Support Pillows;Position options;Skin to skin  LATCH Score: 8  Lactation Tools Discussed/Used Tools: Pump Breast pump type: Manual   Consult Status Consult Status: Complete Date: 04/23/15 Follow-up type: In-patient    Alfred Levins 04/23/2015, 4:23 PM

## 2015-04-24 ENCOUNTER — Inpatient Hospital Stay (HOSPITAL_COMMUNITY): Admission: RE | Admit: 2015-04-24 | Payer: Medicaid Other | Source: Ambulatory Visit

## 2015-04-25 LAB — TYPE AND SCREEN
ABO/RH(D): A NEG
ANTIBODY SCREEN: POSITIVE
DAT, IGG: NEGATIVE
UNIT DIVISION: 0
Unit division: 0

## 2016-05-13 ENCOUNTER — Encounter: Payer: Self-pay | Admitting: Pediatrics

## 2016-08-01 ENCOUNTER — Emergency Department (HOSPITAL_COMMUNITY): Payer: BLUE CROSS/BLUE SHIELD

## 2016-08-01 ENCOUNTER — Encounter (HOSPITAL_COMMUNITY): Payer: Self-pay | Admitting: *Deleted

## 2016-08-01 ENCOUNTER — Emergency Department (HOSPITAL_COMMUNITY)
Admission: EM | Admit: 2016-08-01 | Discharge: 2016-08-01 | Disposition: A | Discharge status: Home / Self Care | Payer: BLUE CROSS/BLUE SHIELD | Attending: Emergency Medicine | Admitting: Emergency Medicine

## 2016-08-01 DIAGNOSIS — R0789 Other chest pain: Secondary | ICD-10-CM | POA: Insufficient documentation

## 2016-08-01 DIAGNOSIS — Z87891 Personal history of nicotine dependence: Secondary | ICD-10-CM | POA: Diagnosis not present

## 2016-08-01 MED ORDER — OXYCODONE-ACETAMINOPHEN 5-325 MG PO TABS: 1.0000 | ORAL_TABLET | ORAL | 0 refills | Status: DC | PRN

## 2016-08-01 MED ORDER — IBUPROFEN 800 MG PO TABS
800.0000 mg | ORAL_TABLET | Freq: Once | ORAL | Status: AC
  Administered 2016-08-01: 800 mg via ORAL
  Filled 2016-08-01: qty 1

## 2016-08-01 NOTE — ED Triage Notes (Signed)
The pt is c/o lt sided lower rib pain since yesterday  No known injury  No recent cold or cough  lmp  2 weeks ago

## 2016-08-01 NOTE — Discharge Instructions (Signed)
Apply ice several times a day. Take naproxen (Aleve) - two tablets at a time, twice a day. Take acetaminophen as needed for additional pain relief. Reserve oxycodone-acetaminophen for severe pain, or to help you sleep at night.

## 2016-08-01 NOTE — ED Provider Notes (Signed)
[facility]-EMERGENCY DEPT Provider Note   CSN: [id number] Arrival date & time: 08/01/16  1854     History   Chief Complaint Chief Complaint  Patient presents with  . Chest Pain    HPI Judith Osborn is a 20 y.o. female.  She noted onset yesterday of sharp left lateral chest wall pain. Pain is worse with movement and with breathing. She rates pain at 10/10. She denies fever, chills, sweats but she denies cough. She denies any trauma. She is not on anything to treat it.   The history is provided by the patient.  Chest Pain      Past Medical History:  Diagnosis Date  . Anxiety   . Depression     Patient Active Problem List   Diagnosis Date Noted  . Anemia 04/23/2015  . Vaginal delivery 04/22/2015  . Gestational hypertension 04/21/2015  . Cramping affecting pregnancy, antepartum 03/24/2015  . History of depression 03/24/2015  . History of anxiety 03/24/2015  . Rh negative state in antepartum period 08/23/2014    Past Surgical History:  Procedure Laterality Date  . NO PAST SURGERIES      OB History    Gravida Para Term Preterm AB Living   1 1 1     1    SAB TAB Ectopic Multiple Live Births         0 1       Home Medications    Prior to Admission medications   Medication Sig Start Date End Date Taking? Authorizing Provider  acetaminophen (TYLENOL) 500 MG tablet Take 1,000 mg by mouth every 6 (six) hours as needed for mild pain or headache.     Historical Provider, MD  ferrous sulfate 325 (65 FE) MG tablet Take 1 tablet (325 mg total) by mouth 2 (two) times daily with a meal. 04/23/15   Nigel BridgemanVicki Latham, CNM  ibuprofen (ADVIL,MOTRIN) 600 MG tablet Take 1 tablet (600 mg total) by mouth every 6 (six) hours as needed. 04/23/15   Nigel BridgemanVicki Latham, CNM  norethindrone (ORTHO MICRONOR) 0.35 MG tablet Take 1 tablet (0.35 mg total) by mouth daily. 05/18/15   Nigel BridgemanVicki Latham, CNM  Prenatal Vit-Fe Fumarate-FA (PRENATAL MULTIVITAMIN) TABS tablet Take 2 tablets by mouth daily at 12 noon.      Historical Provider, MD  ranitidine (ZANTAC) 150 MG tablet Take 1 tablet (150 mg total) by mouth 2 (two) times daily. Patient taking differently: Take 150 mg by mouth 2 (two) times daily as needed for heartburn.  12/21/14   Nigel BridgemanVicki Latham, CNM    Family History Family History  Problem Relation Age of Onset  . Alcohol abuse Father     inpt tx 2007  . Suicidality Father     2007 inpt tx    Social History Social History  Substance Use Topics  . Smoking status: Former Smoker    Types: Cigarettes  . Smokeless tobacco: Never Used  . Alcohol use No     Allergies   Patient has no known allergies.   Review of Systems Review of Systems  Cardiovascular: Positive for chest pain.  All other systems reviewed and are negative.    Physical Exam Updated Vital Signs BP 124/81 (BP Location: Left Arm)   Pulse 91   Temp 98.4 F (36.9 C) (Oral)   Resp 20   LMP 07/12/2016   SpO2 100%   Physical Exam  Vitals reviewed.  20 year old female, resting comfortably and in no acute distress. Vital signs are Normal. Oxygen saturation  is 100%, which is normal. Head is normocephalic and atraumatic. PERRLA, EOMI. Oropharynx is clear. Neck is nontender and supple without adenopathy or JVD. Back is nontender and there is no CVA tenderness. Lungs are clear without rales, wheezes, or rhonchi. Chest has well localized tenderness in the left lateral rib cage which reproduces her pain. Heart has regular rate and rhythm without murmur. Abdomen is soft, flat, nontender without masses or hepatosplenomegaly and peristalsis is normoactive. Extremities have no cyanosis or edema, full range of motion is present. Skin is warm and dry without rash. Neurologic: Mental status is normal, cranial nerves are intact, there are no motor or sensory deficits.  ED Treatments / Results   Radiology Dg Ribs Unilateral W/chest Left  Result Date: 08/01/2016 CLINICAL DATA:  Left anterior to mid left-sided rib pain since  yesterday without known injury. EXAM: LEFT RIBS AND CHEST - 3+ VIEW COMPARISON:  Chest radiograph report from 02/12/2002. FINDINGS: No fracture or other bone lesions are seen involving the ribs. There is no evidence of pneumothorax or pleural effusion. Both lungs are clear. Heart size and mediastinal contours are within normal limits. IMPRESSION: Negative. Electronically Signed   By: Tollie Ethavid  Kwon M.D.   On: 08/01/2016 21:11    Procedures Procedures (including critical care time)  Medications Ordered in ED Medications  ibuprofen (ADVIL,MOTRIN) tablet 800 mg (not administered)     Initial Impression / Assessment and Plan / ED Course  I have reviewed the triage vital signs and the nursing notes.  Pertinent imaging results that were available during my care of the patient were reviewed by me and considered in my medical decision making (see chart for details).  Clinical Course    Chest wall pain. No definite injury. Both sent for rib x-rays and give initial dose of ibuprofen in the ED.  X-rays are negative for fracture. Presumably her pain is from intercostal muscle strain. She is advised to use over-the-counter naproxen or ibuprofen, apply ice as needed. Prescription given for oxycodone-acetaminophen to use if unable to sleep at night because of pain.  Final Clinical Impressions(s) / ED Diagnoses   Final diagnoses:  Chest wall pain    New Prescriptions New Prescriptions   No medications on file     Dione Boozeavid Yair Dusza, MD 08/01/16 2128

## 2016-09-26 ENCOUNTER — Emergency Department (HOSPITAL_COMMUNITY)
Admission: EM | Admit: 2016-09-26 | Discharge: 2016-09-26 | Disposition: A | Discharge status: Home / Self Care | Payer: BLUE CROSS/BLUE SHIELD | Attending: Emergency Medicine | Admitting: Emergency Medicine

## 2016-09-26 ENCOUNTER — Encounter (HOSPITAL_COMMUNITY): Payer: Self-pay | Admitting: Emergency Medicine

## 2016-09-26 DIAGNOSIS — Z87891 Personal history of nicotine dependence: Secondary | ICD-10-CM | POA: Insufficient documentation

## 2016-09-26 DIAGNOSIS — L600 Ingrowing nail: Secondary | ICD-10-CM | POA: Diagnosis not present

## 2016-09-26 DIAGNOSIS — M79675 Pain in left toe(s): Secondary | ICD-10-CM | POA: Diagnosis present

## 2016-09-26 MED ORDER — HYDROCODONE-ACETAMINOPHEN 5-325 MG PO TABS: 1.0000 | ORAL_TABLET | ORAL | 0 refills | Status: DC | PRN

## 2016-09-26 MED ORDER — NAPROXEN 500 MG PO TABS: 500.0000 mg | ORAL_TABLET | Freq: Two times a day (BID) | ORAL | 0 refills | Status: DC

## 2016-09-26 MED ORDER — BACITRACIN ZINC 500 UNIT/GM EX OINT: TOPICAL_OINTMENT | Freq: Two times a day (BID) | CUTANEOUS | Status: DC

## 2016-09-26 MED ORDER — NAPROXEN 250 MG PO TABS
500.0000 mg | ORAL_TABLET | Freq: Once | ORAL | Status: AC
  Administered 2016-09-26: 500 mg via ORAL
  Filled 2016-09-26: qty 2

## 2016-09-26 MED ORDER — CEPHALEXIN 500 MG PO CAPS: 500.0000 mg | ORAL_CAPSULE | Freq: Four times a day (QID) | ORAL | 0 refills | Status: DC

## 2016-09-26 MED ORDER — LIDOCAINE HCL (PF) 1 % IJ SOLN
30.0000 mL | Freq: Once | INTRAMUSCULAR | Status: AC
Start: 2016-09-26 — End: 2016-09-26
  Administered 2016-09-26: 30 mL
  Filled 2016-09-26: qty 30

## 2016-09-26 NOTE — ED Triage Notes (Signed)
Pt. Stated, I pulled a nail and it got infected. Left big toe.It keps me up at night.

## 2016-09-26 NOTE — ED Notes (Signed)
Declined W/C at D/C and was escorted to lobby by RN. 

## 2016-09-26 NOTE — ED Provider Notes (Signed)
[facility]-EMERGENCY DEPT Provider Note   CSN: [id number] Arrival date & time: 09/26/16  [id number]   By signing my name below, I, Clarisse GougeXavier Herndon, attest that this documentation has been prepared under the direction and in the presence of Cheri FowlerKayla Jakie Debow, PA-C. Electronically signed, Clarisse GougeXavier Herndon, ED Scribe. 09/26/16. 10:36 AM.   History   Chief Complaint Chief Complaint  Patient presents with  . Wound Infection  . Toe Pain   The history is provided by the patient and medical records. No language interpreter was used.    HPI Comments: Judith Osborn is a 20 y.o. female who presents to the Emergency Department complaining of gradually worsening, sudden onset left sided toe pain x 3 days. States she pulled out an in-grown nail on the left big toe 4 days ago, and pain and swelling to the affected toe began 3 days ago. Pt is concerned that that the toe may have become infected. Further notes pain is keeping her awake at night, and it is causing moderate to mild discomfort when wearing shoes. Pt reports she has tried epsom salts, hydrogen peroxide and neosporin at home without relief. Pt ambulatory. No fever, chills, N/V, or malaise.   Past Medical History:  Diagnosis Date  . Anxiety   . Depression     Patient Active Problem List   Diagnosis Date Noted  . Anemia 04/23/2015  . Vaginal delivery 04/22/2015  . Gestational hypertension 04/21/2015  . Cramping affecting pregnancy, antepartum 03/24/2015  . History of depression 03/24/2015  . History of anxiety 03/24/2015  . Rh negative state in antepartum period 08/23/2014    Past Surgical History:  Procedure Laterality Date  . NO PAST SURGERIES      OB History    Gravida Para Term Preterm AB Living   1 1 1     1    SAB TAB Ectopic Multiple Live Births         0 1       Home Medications    Prior to Admission medications   Medication Sig Start Date End Date Taking? Authorizing Provider  acetaminophen (TYLENOL) 500 MG tablet Take 1,000 mg  by mouth every 6 (six) hours as needed for mild pain or headache.     Historical Provider, MD  cephALEXin (KEFLEX) 500 MG capsule Take 1 capsule (500 mg total) by mouth 4 (four) times daily. 09/26/16   Cheri FowlerKayla Rhodesia Stanger, PA-C  ferrous sulfate 325 (65 FE) MG tablet Take 1 tablet (325 mg total) by mouth 2 (two) times daily with a meal. 04/23/15   Nigel BridgemanVicki Latham, CNM  HYDROcodone-acetaminophen (NORCO/VICODIN) 5-325 MG tablet Take 1-2 tablets by mouth every 4 (four) hours as needed. 09/26/16   Cheri FowlerKayla Deaglan Lile, PA-C  ibuprofen (ADVIL,MOTRIN) 600 MG tablet Take 1 tablet (600 mg total) by mouth every 6 (six) hours as needed. 04/23/15   Nigel BridgemanVicki Latham, CNM  naproxen (NAPROSYN) 500 MG tablet Take 1 tablet (500 mg total) by mouth 2 (two) times daily. 09/26/16   Cheri FowlerKayla Peta Peachey, PA-C  norethindrone (ORTHO MICRONOR) 0.35 MG tablet Take 1 tablet (0.35 mg total) by mouth daily. 05/18/15   Nigel BridgemanVicki Latham, CNM  oxyCODONE-acetaminophen (PERCOCET) 5-325 MG tablet Take 1 tablet by mouth every 4 (four) hours as needed for moderate pain. 08/01/16   Dione Boozeavid Glick, MD  Prenatal Vit-Fe Fumarate-FA (PRENATAL MULTIVITAMIN) TABS tablet Take 2 tablets by mouth daily at 12 noon.     Historical Provider, MD  ranitidine (ZANTAC) 150 MG tablet Take 1 tablet (150 mg total) by mouth  2 (two) times daily. Patient taking differently: Take 150 mg by mouth 2 (two) times daily as needed for heartburn.  12/21/14   Nigel BridgemanVicki Latham, CNM    Family History Family History  Problem Relation Age of Onset  . Alcohol abuse Father     inpt tx 2007  . Suicidality Father     2007 inpt tx    Social History Social History  Substance Use Topics  . Smoking status: Former Smoker    Types: Cigarettes  . Smokeless tobacco: Former NeurosurgeonUser  . Alcohol use No     Allergies   Patient has no known allergies.   Review of Systems Review of Systems  All other systems reviewed and are negative.  A complete 10 system review of systems was obtained and all systems are negative  except as noted in the HPI and PMH.    Physical Exam Updated Vital Signs BP 123/86 (BP Location: Left Arm)   Pulse 85   Temp 98.5 F (36.9 C) (Oral)   Resp 12   Ht 5\' 5"  (1.651 m)   Wt 172 lb (78 kg)   LMP 09/24/2016   SpO2 100%   BMI 28.62 kg/m   Physical Exam  Constitutional: She is oriented to person, place, and time. She appears well-developed and well-nourished. No distress.  HENT:  Head: Normocephalic and atraumatic.  Eyes: EOM are normal.  Neck: Normal range of motion.  Cardiovascular: Normal rate, regular rhythm and normal heart sounds.   Pulmonary/Chest: Effort normal and breath sounds normal.  Abdominal: Soft. She exhibits no distension. There is no tenderness.  Musculoskeletal: Normal range of motion.  Neurological: She is alert and oriented to person, place, and time.  Skin: Skin is warm and dry.  Left great toe with ingrown toenail with infection and granulation at proximal nail fold with surrounding tenderness and erythema.   Psychiatric: She has a normal mood and affect. Judgment normal.  Nursing note and vitals reviewed.    ED Treatments / Results  DIAGNOSTIC STUDIES: Oxygen Saturation is 100% on RA, normal by my interpretation.    COORDINATION OF CARE: 10:36 AM Discussed treatment plan with pt at bedside and pt agreed to plan.  Labs (all labs ordered are listed, but only abnormal results are displayed) Labs Reviewed - No data to display  EKG  EKG Interpretation None       Radiology No results found.  Procedures Excise ingrown toenail Date/Time: 09/26/2016 11:27 AM Performed by: Cheri FowlerOSE, Tsuruko Murtha Authorized by: Cheri FowlerOSE, Dajuana Palen  Consent: Verbal consent obtained. Risks and benefits: risks, benefits and alternatives were discussed Consent given by: patient Patient understanding: patient states understanding of the procedure being performed Patient consent: the patient's understanding of the procedure matches consent given Procedure consent: procedure  consent matches procedure scheduled Patient identity confirmed: verbally with patient Time out: Immediately prior to procedure a "time out" was called to verify the correct patient, procedure, equipment, support staff and site/side marked as required. Preparation: Patient was prepped and draped in the usual sterile fashion. Local anesthesia used: yes Anesthesia: see MAR for details  Anesthesia: Local anesthesia used: yes  Sedation: Patient sedated: no Patient tolerance: Patient tolerated the procedure well with no immediate complications    (including critical care time)  NERVE BLOCK Performed by: Cheri FowlerKayla Richard Ritchey Consent: Verbal consent obtained. Required items: required blood products, implants, devices, and special equipment available Time out: Immediately prior to procedure a "time out" was called to verify the correct patient, procedure, equipment, support staff and  site/side marked as required.  Indication: partial left great toenail removal Nerve block body site: left great toe  Preparation: Patient was prepped and draped in the usual sterile fashion. Needle gauge: 25 G Location technique: anatomical landmarks  Local anesthetic: lidocaine 1% without epinephrine   Anesthetic total: 8 ml  Outcome: pain improved Patient tolerance: Patient tolerated the procedure well with no immediate complications.     Medications Ordered in ED Medications  naproxen (NAPROSYN) tablet 500 mg (not administered)  bacitracin ointment (not administered)  lidocaine (PF) (XYLOCAINE) 1 % injection 30 mL (30 mLs Infiltration Given 09/26/16 1042)     Initial Impression / Assessment and Plan / ED Course  I have reviewed the triage vital signs and the nursing notes.  Pertinent labs & imaging results that were available during my care of the patient were reviewed by me and considered in my medical decision making (see chart for details).  Clinical Course    Patient with ingrown toenail with  granulation tissue from infection with surrounding erythema. No systemic symptoms. Toenail was excised in the ED. Given extent of erythema will discharge home with Keflex. Short course of Norco as well as naproxen. Patient advised to perform warm soaks 3 times a day. Patient instructed on proper wound care including bacitracin and nonadherent dressings. Follow-up with PCP. Return precautions discussed. Stable for discharge.   Final Clinical Impressions(s) / ED Diagnoses   Final diagnoses:  Ingrown left big toenail    New Prescriptions New Prescriptions   CEPHALEXIN (KEFLEX) 500 MG CAPSULE    Take 1 capsule (500 mg total) by mouth 4 (four) times daily.   HYDROCODONE-ACETAMINOPHEN (NORCO/VICODIN) 5-325 MG TABLET    Take 1-2 tablets by mouth every 4 (four) hours as needed.   NAPROXEN (NAPROSYN) 500 MG TABLET    Take 1 tablet (500 mg total) by mouth 2 (two) times daily.   I personally performed the services described in this documentation, which was scribed in my presence. The recorded information has been reviewed and is accurate.    Cheri Fowler, PA-C 09/26/16 1149    Rolan Bucco, MD 09/26/16 1225

## 2016-12-14 ENCOUNTER — Encounter (HOSPITAL_COMMUNITY): Payer: Self-pay

## 2016-12-14 ENCOUNTER — Emergency Department (HOSPITAL_COMMUNITY)
Admission: EM | Admit: 2016-12-14 | Discharge: 2016-12-14 | Disposition: A | Discharge status: Home / Self Care | Payer: BLUE CROSS/BLUE SHIELD | Attending: Emergency Medicine | Admitting: Emergency Medicine

## 2016-12-14 DIAGNOSIS — M791 Myalgia, unspecified site: Secondary | ICD-10-CM

## 2016-12-14 DIAGNOSIS — H9202 Otalgia, left ear: Secondary | ICD-10-CM | POA: Insufficient documentation

## 2016-12-14 DIAGNOSIS — R509 Fever, unspecified: Secondary | ICD-10-CM

## 2016-12-14 DIAGNOSIS — Z79899 Other long term (current) drug therapy: Secondary | ICD-10-CM | POA: Insufficient documentation

## 2016-12-14 DIAGNOSIS — J3489 Other specified disorders of nose and nasal sinuses: Secondary | ICD-10-CM | POA: Insufficient documentation

## 2016-12-14 DIAGNOSIS — J029 Acute pharyngitis, unspecified: Secondary | ICD-10-CM | POA: Diagnosis present

## 2016-12-14 DIAGNOSIS — F17210 Nicotine dependence, cigarettes, uncomplicated: Secondary | ICD-10-CM | POA: Insufficient documentation

## 2016-12-14 LAB — URINALYSIS, ROUTINE W REFLEX MICROSCOPIC
BILIRUBIN URINE: NEGATIVE
Glucose, UA: NEGATIVE mg/dL
KETONES UR: NEGATIVE mg/dL
Nitrite: NEGATIVE
Protein, ur: NEGATIVE mg/dL
Specific Gravity, Urine: 1.02 (ref 1.005–1.030)
pH: 7 (ref 5.0–8.0)

## 2016-12-14 LAB — RAPID STREP SCREEN (MED CTR MEBANE ONLY): Streptococcus, Group A Screen (Direct): NEGATIVE

## 2016-12-14 LAB — POC URINE PREG, ED: Preg Test, Ur: NEGATIVE

## 2016-12-14 MED ORDER — AMOXICILLIN 500 MG PO CAPS: 500.0000 mg | ORAL_CAPSULE | Freq: Three times a day (TID) | ORAL | 0 refills | Status: DC

## 2016-12-14 MED ORDER — DEXAMETHASONE SODIUM PHOSPHATE 10 MG/ML IJ SOLN
10.0000 mg | Freq: Once | INTRAMUSCULAR | Status: AC
  Administered 2016-12-14: 10 mg via INTRAMUSCULAR
  Filled 2016-12-14: qty 1

## 2016-12-14 MED ORDER — ACETAMINOPHEN 325 MG PO TABS
650.0000 mg | ORAL_TABLET | Freq: Once | ORAL | Status: AC
  Administered 2016-12-14: 650 mg via ORAL
  Filled 2016-12-14: qty 2

## 2016-12-14 MED ORDER — KETOROLAC TROMETHAMINE 30 MG/ML IJ SOLN: 30.0000 mg | Freq: Once | INTRAMUSCULAR | Status: DC

## 2016-12-14 NOTE — Discharge Instructions (Signed)
Her strep test was negative. Your urine does not show any signs of infection however I will culture the urine and he'll be notified if she needed antibiotics. If you developed any urinary symptoms please return to the ED. Make sure you're alternating Motrin and Tylenol. Warm soak baths with some Epsom salts. Follow up with her primary care doctor. Have discussed possible reasons for your symptoms. If her back pain worsens please return to the ED.

## 2016-12-14 NOTE — ED Triage Notes (Signed)
Patient c/o fever, sore throat , and bilateral ear pain since yesterday.

## 2016-12-14 NOTE — ED Notes (Signed)
Pt ambulatory and independent at discharge.  Verbalized understanding of discharge instructions 

## 2016-12-14 NOTE — ED Provider Notes (Signed)
[facility]-EMERGENCY DEPT Provider Note   CSN: [id number] Arrival date & time: 12/14/16  1441  By signing my name below, I, Sonum Patel, attest that this documentation has been prepared under the direction and in the presence of Rise MuKenneth T Bodhi Moradi, PA-C. Electronically Signed: Leone PayorSonum Patel, Scribe. 12/14/16. 5:04 PM.  History   Chief Complaint Chief Complaint  Patient presents with  . Sore Throat  . Otalgia  . Fever    The history is provided by the patient. No language interpreter was used.    HPI Comments: Judith Osborn is a 21 y.o. female who presents to the Emergency Department complaining of a gradual onset, constant, gradually worsening sore throat that began yesterday. She reports associated fever and nausea yesterday and congestion, left ear pain, and generalized body aches today specifically her back. She has tried ibuprofen without relief. She states swallowing and eating worsens her pain. She denies cough or inability to swallow.   Past Medical History:  Diagnosis Date  . Anxiety   . Depression     Patient Active Problem List   Diagnosis Date Noted  . Anemia 04/23/2015  . Vaginal delivery 04/22/2015  . Gestational hypertension 04/21/2015  . Cramping affecting pregnancy, antepartum 03/24/2015  . History of depression 03/24/2015  . History of anxiety 03/24/2015  . Rh negative state in antepartum period 08/23/2014    Past Surgical History:  Procedure Laterality Date  . NO PAST SURGERIES      OB History    Gravida Para Term Preterm AB Living   1 1 1     1    SAB TAB Ectopic Multiple Live Births         0 1       Home Medications    Prior to Admission medications   Medication Sig Start Date End Date Taking? Authorizing Provider  acetaminophen (TYLENOL) 500 MG tablet Take 1,000 mg by mouth every 6 (six) hours as needed for mild pain or headache.     Historical Provider, MD  cephALEXin (KEFLEX) 500 MG capsule Take 1 capsule (500 mg total) by mouth 4 (four)  times daily. 09/26/16   Cheri FowlerKayla Rose, PA-C  ferrous sulfate 325 (65 FE) MG tablet Take 1 tablet (325 mg total) by mouth 2 (two) times daily with a meal. 04/23/15   Nigel BridgemanVicki Latham, CNM  HYDROcodone-acetaminophen (NORCO/VICODIN) 5-325 MG tablet Take 1-2 tablets by mouth every 4 (four) hours as needed. 09/26/16   Cheri FowlerKayla Rose, PA-C  ibuprofen (ADVIL,MOTRIN) 600 MG tablet Take 1 tablet (600 mg total) by mouth every 6 (six) hours as needed. 04/23/15   Nigel BridgemanVicki Latham, CNM  naproxen (NAPROSYN) 500 MG tablet Take 1 tablet (500 mg total) by mouth 2 (two) times daily. 09/26/16   Cheri FowlerKayla Rose, PA-C  norethindrone (ORTHO MICRONOR) 0.35 MG tablet Take 1 tablet (0.35 mg total) by mouth daily. 05/18/15   Nigel BridgemanVicki Latham, CNM  oxyCODONE-acetaminophen (PERCOCET) 5-325 MG tablet Take 1 tablet by mouth every 4 (four) hours as needed for moderate pain. 08/01/16   Dione Boozeavid Glick, MD  Prenatal Vit-Fe Fumarate-FA (PRENATAL MULTIVITAMIN) TABS tablet Take 2 tablets by mouth daily at 12 noon.     Historical Provider, MD  ranitidine (ZANTAC) 150 MG tablet Take 1 tablet (150 mg total) by mouth 2 (two) times daily. Patient taking differently: Take 150 mg by mouth 2 (two) times daily as needed for heartburn.  12/21/14   Nigel BridgemanVicki Latham, CNM    Family History Family History  Problem Relation Age of Onset  .  Alcohol abuse Father     inpt tx 2007  . Suicidality Father     2007 inpt tx    Social History Social History  Substance Use Topics  . Smoking status: Current Every Day Smoker    Packs/day: 0.25    Types: Cigarettes  . Smokeless tobacco: Former Neurosurgeon  . Alcohol use Yes     Comment: socially     Allergies   Patient has no known allergies.   Review of Systems Review of Systems  Constitutional: Positive for fever.  HENT: Positive for congestion, ear pain and sore throat. Negative for trouble swallowing.   Respiratory: Negative for cough.   Gastrointestinal: Positive for nausea.  Musculoskeletal: Positive for myalgias.      Physical Exam Updated Vital Signs BP 124/61 (BP Location: Right Arm)   Pulse (!) 102   Temp 100.2 F (37.9 C) (Oral)   Resp 18   Ht 5\' 5"  (1.651 m)   Wt 75.3 kg   LMP 11/14/2016 (Approximate)   SpO2 97%   BMI 27.62 kg/m   Physical Exam  Constitutional: She is oriented to person, place, and time. She appears well-developed and well-nourished. No distress.  Non toxic appearing.  HENT:  Head: Normocephalic and atraumatic.  Right Ear: Tympanic membrane, external ear and ear canal normal.  Left Ear: External ear and ear canal normal. Tympanic membrane is erythematous and bulging. A middle ear effusion is present.  Nose: Mucosal edema and rhinorrhea present.  Mouth/Throat: Uvula is midline and mucous membranes are normal. No trismus in the jaw. No uvula swelling. Oropharyngeal exudate, posterior oropharyngeal edema and posterior oropharyngeal erythema present. No tonsillar abscesses. Tonsils are 1+ on the right. Tonsils are 1+ on the left. Tonsillar exudate.  Neck: Normal range of motion. Neck supple.  No nuchal rigidty  Cardiovascular: Tachycardia present.   Pulmonary/Chest: Breath sounds normal. She is in respiratory distress. She has no wheezes. She has no rales.  Musculoskeletal:  Generalized body aches. No mideline t or l spine tenderness. Mild lumbar paraspinal tenderness.  Neurological: She is alert and oriented to person, place, and time.  Skin: Skin is warm and dry. Capillary refill takes less than 2 seconds.  Psychiatric: She has a normal mood and affect.  Nursing note and vitals reviewed.    ED Treatments / Results  DIAGNOSTIC STUDIES: Oxygen Saturation is 100% on RA, normal by my interpretation.    COORDINATION OF CARE: 5:03 PM Discussed treatment plan with pt at bedside and pt agreed to plan.   Labs (all labs ordered are listed, but only abnormal results are displayed) Labs Reviewed  URINE CULTURE - Abnormal; Notable for the following:       Result Value    Culture MULTIPLE SPECIES PRESENT, SUGGEST RECOLLECTION (*)    All other components within normal limits  URINALYSIS, ROUTINE W REFLEX MICROSCOPIC - Abnormal; Notable for the following:    Hgb urine dipstick SMALL (*)    Leukocytes, UA TRACE (*)    Bacteria, UA RARE (*)    Squamous Epithelial / LPF 0-5 (*)    All other components within normal limits  RAPID STREP SCREEN (NOT AT [facility])  CULTURE, GROUP A STREP ([facility])  POC URINE PREG, ED    EKG  EKG Interpretation None       Radiology No results found.  Procedures Procedures (including critical care time)  Medications Ordered in ED Medications  acetaminophen (TYLENOL) tablet 650 mg (650 mg Oral Given 12/14/16 1724)  dexamethasone (DECADRON) injection  10 mg (10 mg Intramuscular Given 12/14/16 1725)     Initial Impression / Assessment and Plan / ED Course  I have reviewed the triage vital signs and the nursing notes.  Pertinent labs & imaging results that were available during my care of the patient were reviewed by me and considered in my medical decision making (see chart for details).     Patient with symptoms consistent with influenza.  Vitals are stable, low-grade fever.  No signs of dehydration, tolerating PO's.  Lungs are clear. Due to patient's presentation and physical exam a chest x-ray was not ordered bc likely diagnosis of flu.  Strep test was negative. No signs of pta or deep neck infection. [place] with small amount of hgb, bacteria and squamous epithelial. Denies any urinary symptoms. Doubt uti or peylo. Will culture. Will not treat at this time. Pt felt much improved after steroids and tylenol. She refused Toradol.  Left ear with mild symptoms of otitis media. Will give abx to start taking if ear pain last longer than 5 days. No signs of mastoiditis. Patient will be discharged with instructions to orally hydrate, rest, and use over-the-counter medications such as anti-inflammatories ibuprofen and Aleve for muscle aches  and Tylenol for fever. Strict return precautions dicussed. All question answered prior to d/c. VS stable.   Final Clinical Impressions(s) / ED Diagnoses   Final diagnoses:  Sore throat  Left ear pain  Rhinorrhea  Fever, unspecified fever cause  Myalgia    New Prescriptions Discharge Medication List as of 12/14/2016  7:26 PM    START taking these medications   Details  amoxicillin (AMOXIL) 500 MG capsule Take 1 capsule (500 mg total) by mouth 3 (three) times daily., Starting Tue 12/14/2016, Print       I personally performed the services described in this documentation, which was scribed in my presence. The recorded information has been reviewed and is accurate.    Rise Mu, PA-C 12/16/16 1610    Arby Barrette, MD 12/25/16 [phone]

## 2016-12-14 NOTE — ED Notes (Signed)
Pt given graham crackers and coke  

## 2016-12-16 LAB — URINE CULTURE

## 2016-12-17 LAB — CULTURE, GROUP A STREP (THRC)

## 2017-08-18 ENCOUNTER — Other Ambulatory Visit: Payer: Self-pay

## 2017-08-18 ENCOUNTER — Emergency Department (HOSPITAL_COMMUNITY)
Admission: EM | Admit: 2017-08-18 | Discharge: 2017-08-18 | Disposition: A | Discharge status: Home / Self Care | Payer: BLUE CROSS/BLUE SHIELD | Attending: Emergency Medicine | Admitting: Emergency Medicine

## 2017-08-18 ENCOUNTER — Encounter (HOSPITAL_COMMUNITY): Payer: Self-pay

## 2017-08-18 DIAGNOSIS — Z79899 Other long term (current) drug therapy: Secondary | ICD-10-CM | POA: Insufficient documentation

## 2017-08-18 DIAGNOSIS — F17210 Nicotine dependence, cigarettes, uncomplicated: Secondary | ICD-10-CM | POA: Insufficient documentation

## 2017-08-18 DIAGNOSIS — J020 Streptococcal pharyngitis: Secondary | ICD-10-CM | POA: Insufficient documentation

## 2017-08-18 LAB — RAPID STREP SCREEN (MED CTR MEBANE ONLY): STREPTOCOCCUS, GROUP A SCREEN (DIRECT): POSITIVE — AB

## 2017-08-18 MED ORDER — DEXAMETHASONE SODIUM PHOSPHATE 10 MG/ML IJ SOLN
10.0000 mg | Freq: Once | INTRAMUSCULAR | Status: AC
  Administered 2017-08-18: 10 mg via INTRAMUSCULAR
  Filled 2017-08-18: qty 1

## 2017-08-18 MED ORDER — AMOXICILLIN 500 MG PO CAPS
500.0000 mg | ORAL_CAPSULE | Freq: Once | ORAL | Status: AC
  Administered 2017-08-18: 500 mg via ORAL
  Filled 2017-08-18: qty 1

## 2017-08-18 MED ORDER — IBUPROFEN 400 MG PO TABS
400.0000 mg | ORAL_TABLET | Freq: Once | ORAL | Status: AC
  Administered 2017-08-18: 400 mg via ORAL
  Filled 2017-08-18: qty 1

## 2017-08-18 MED ORDER — AMOXICILLIN 500 MG PO CAPS: 500.0000 mg | ORAL_CAPSULE | Freq: Three times a day (TID) | ORAL | 0 refills | Status: DC

## 2017-08-18 NOTE — Discharge Instructions (Signed)
It was our pleasure to provide your ER care today - we hope that you feel better.  Take amoxicillin as prescribed.  Take motrin or aleve as need.   Use throat lozenges as need for symptom relief.

## 2017-08-18 NOTE — ED Provider Notes (Addendum)
[facility] Provider Note   CSN: [id number] Arrival date & time: 08/18/17  0813     History   Chief Complaint No chief complaint on file.   HPI Judith Osborn is a 21 y.o. female.  Patient c/o onset sore throat in past day. Throat pain diffuse/bilateral. Symptoms moderate, persistent. No trouble breathing or swallowing. bil ear fullness/itching. No cough. No sinus congestion or pain. Denies headache. No neck pain or stiffness. No fever or chills. No known ill contacts.    The history is provided by the patient.    Past Medical History:  Diagnosis Date  . Anxiety   . Depression     Patient Active Problem List   Diagnosis Date Noted  . Anemia 04/23/2015  . Vaginal delivery 04/22/2015  . Gestational hypertension 04/21/2015  . Cramping affecting pregnancy, antepartum 03/24/2015  . History of depression 03/24/2015  . History of anxiety 03/24/2015  . Rh negative state in antepartum period 08/23/2014    Past Surgical History:  Procedure Laterality Date  . NO PAST SURGERIES      OB History    Gravida Para Term Preterm AB Living   1 1 1     1    SAB TAB Ectopic Multiple Live Births         0 1       Home Medications    Prior to Admission medications   Medication Sig Start Date End Date Taking? Authorizing Provider  acetaminophen (TYLENOL) 500 MG tablet Take 1,000 mg by mouth every 6 (six) hours as needed for mild pain or headache.     [provider]  amoxicillin (AMOXIL) 500 MG capsule Take 1 capsule (500 mg total) by mouth 3 (three) times daily. 12/14/16   Rise MuLeaphart, Kenneth T, PA-C  cephALEXin (KEFLEX) 500 MG capsule Take 1 capsule (500 mg total) by mouth 4 (four) times daily. 09/26/16   Cheri Fowlerose, Kayla, PA-C  ferrous sulfate 325 (65 FE) MG tablet Take 1 tablet (325 mg total) by mouth 2 (two) times daily with a meal. 04/23/15   Nigel BridgemanLatham, Vicki, CNM  HYDROcodone-acetaminophen (NORCO/VICODIN) 5-325 MG tablet Take 1-2  tablets by mouth every 4 (four) hours as needed. 09/26/16   Cheri Fowlerose, Kayla, PA-C  ibuprofen (ADVIL,MOTRIN) 600 MG tablet Take 1 tablet (600 mg total) by mouth every 6 (six) hours as needed. 04/23/15   Nigel BridgemanLatham, Vicki, CNM  naproxen (NAPROSYN) 500 MG tablet Take 1 tablet (500 mg total) by mouth 2 (two) times daily. 09/26/16   Cheri Fowlerose, Kayla, PA-C  norethindrone (ORTHO MICRONOR) 0.35 MG tablet Take 1 tablet (0.35 mg total) by mouth daily. 05/18/15   Nigel BridgemanLatham, Vicki, CNM  oxyCODONE-acetaminophen (PERCOCET) 5-325 MG tablet Take 1 tablet by mouth every 4 (four) hours as needed for moderate pain. 08/01/16   Dione BoozeGlick, David, MD  Prenatal Vit-Fe Fumarate-FA (PRENATAL MULTIVITAMIN) TABS tablet Take 2 tablets by mouth daily at 12 noon.     [provider]  ranitidine (ZANTAC) 150 MG tablet Take 1 tablet (150 mg total) by mouth 2 (two) times daily. Patient taking differently: Take 150 mg by mouth 2 (two) times daily as needed for heartburn.  12/21/14   Nigel BridgemanLatham, Vicki, CNM    Family History Family History  Problem Relation Age of Onset  . Alcohol abuse Father        inpt tx 2007  . Suicidality Father        2007 inpt tx    Social History Social History  Tobacco Use  . Smoking status: Current Every Day Smoker    Packs/day: 0.25    Types: Cigarettes  . Smokeless tobacco: Former Engineer, waterUser  Substance Use Topics  . Alcohol use: Yes    Comment: socially  . Drug use: No     Allergies   Patient has no known allergies.   Review of Systems Review of Systems  Constitutional: Negative for fever.  HENT: Positive for sore throat. Negative for rhinorrhea, trouble swallowing and voice change.   Respiratory: Negative for cough and shortness of breath.   Gastrointestinal: Negative for vomiting.  Skin: Negative for rash.  Neurological: Negative for headaches.     Physical Exam Updated Vital Signs BP 139/90 (BP Location: Right Arm)   Pulse 93   Temp 98.1 F (36.7 C) (Oral)   Resp 16   Ht 1.6 m (5\' 3" )    Wt 74.8 kg (165 lb)   SpO2 97%   BMI 29.23 kg/m   Physical Exam  Constitutional: She appears well-developed and well-nourished. No distress.  HENT:  Right Ear: External ear normal.  Left Ear: External ear normal.  Nose: Nose normal.  Pharynx erythematous. No asymmetric swelling or abscess.   Eyes: Conjunctivae are normal. No scleral icterus.  Neck: Neck supple. No tracheal deviation present. No thyromegaly present.  No stiffness or rigidity  Cardiovascular: Normal rate, regular rhythm, normal heart sounds and intact distal pulses. Exam reveals no gallop and no friction rub.  No murmur heard. Pulmonary/Chest: Effort normal and breath sounds normal. No respiratory distress.  Abdominal: Normal appearance. She exhibits no distension. There is no tenderness.  No hsm.   Musculoskeletal: She exhibits no edema.  Lymphadenopathy:    She has no cervical adenopathy.  Neurological: She is alert.  Skin: Skin is warm and dry. No rash noted. She is not diaphoretic.  Psychiatric: She has a normal mood and affect.  Nursing note and vitals reviewed.    ED Treatments / Results  Labs (all labs ordered are listed, but only abnormal results are displayed) Labs Reviewed  RAPID STREP SCREEN (NOT AT [facility])    EKG  EKG Interpretation None       Radiology No results found.  Procedures Procedures (including critical care time)  Medications Ordered in ED Medications - No data to display   Initial Impression / Assessment and Plan / ED Course  I have reviewed the triage vital signs and the nursing notes.  Pertinent labs & imaging results that were available during my care of the patient were reviewed by me and considered in my medical decision making (see chart for details).  Strep screen sent.  Motrin po.  Reviewed nursing notes and prior charts for additional history.   Strep test positive. Pt informed.   Amoxicillin po.   Pt requests steroid shot - says it helped a lot last  time she had strep.    Decadron im,  Final Clinical Impressions(s) / ED Diagnoses   Final diagnoses:  None    ED Discharge Orders    None         Cathren LaineSteinl, Kadelyn Dimascio, MD 08/18/17 [phone]

## 2017-08-18 NOTE — ED Notes (Signed)
C/o sore throat onset 2 days ago 

## 2017-08-18 NOTE — ED Triage Notes (Addendum)
Patient complains of sore throat x 1 day with ear fullness, patient states that she thinks she has strep, hx of same

## 2017-09-01 ENCOUNTER — Emergency Department (HOSPITAL_COMMUNITY): Payer: Self-pay

## 2017-09-01 ENCOUNTER — Encounter (HOSPITAL_COMMUNITY): Payer: Self-pay

## 2017-09-01 ENCOUNTER — Other Ambulatory Visit: Payer: Self-pay

## 2017-09-01 ENCOUNTER — Emergency Department (HOSPITAL_COMMUNITY)
Admission: EM | Admit: 2017-09-01 | Discharge: 2017-09-01 | Disposition: A | Discharge status: Home / Self Care | Payer: Self-pay | Attending: Emergency Medicine | Admitting: Emergency Medicine

## 2017-09-01 DIAGNOSIS — R300 Dysuria: Secondary | ICD-10-CM | POA: Insufficient documentation

## 2017-09-01 DIAGNOSIS — F17210 Nicotine dependence, cigarettes, uncomplicated: Secondary | ICD-10-CM | POA: Insufficient documentation

## 2017-09-01 DIAGNOSIS — Z79899 Other long term (current) drug therapy: Secondary | ICD-10-CM | POA: Insufficient documentation

## 2017-09-01 DIAGNOSIS — M549 Dorsalgia, unspecified: Secondary | ICD-10-CM | POA: Insufficient documentation

## 2017-09-01 LAB — URINALYSIS, ROUTINE W REFLEX MICROSCOPIC
BILIRUBIN URINE: NEGATIVE
GLUCOSE, UA: NEGATIVE mg/dL
HGB URINE DIPSTICK: NEGATIVE
Ketones, ur: NEGATIVE mg/dL
Leukocytes, UA: NEGATIVE
Nitrite: NEGATIVE
PH: 5 (ref 5.0–8.0)
Protein, ur: NEGATIVE mg/dL
SPECIFIC GRAVITY, URINE: 1.029 (ref 1.005–1.030)

## 2017-09-01 LAB — POC URINE PREG, ED: Preg Test, Ur: NEGATIVE

## 2017-09-01 MED ORDER — NAPROXEN 500 MG PO TABS: 500.0000 mg | ORAL_TABLET | Freq: Two times a day (BID) | ORAL | 0 refills | Status: DC

## 2017-09-01 MED ORDER — KETOROLAC TROMETHAMINE 30 MG/ML IJ SOLN
30.0000 mg | Freq: Once | INTRAMUSCULAR | Status: AC
  Administered 2017-09-01: 30 mg via INTRAMUSCULAR
  Filled 2017-09-01: qty 1

## 2017-09-01 NOTE — ED Provider Notes (Signed)
[facility] EMERGENCY DEPARTMENT Provider Note   CSN: [id number] Arrival date & time: 09/01/17  1904     History   Chief Complaint Chief Complaint  Patient presents with  . Back Pain    HPI Judith Osborn is a 21 y.o. female.  Judith Osborn is a 21 y.o. Female who presents to the ED complaining of mid back pain starting prior to arrival.  She reports she was driving in her car when she began having mid back pain around where her bra strap lies.  She reports her pain comes with movement and almost resolves completely without movement.  She reports it seems to radiate all the way across her mid back.  She denies any shortness of breath or chest pain.  No coughing.  No treatments attempted prior to arrival.  She denies any lower back pain.  No back injury.  Last mental cycle was 08/06/17.  She reports she is trying to get pregnant.  She also reports some dysuria ongoing for several days.  She denies vaginal bleeding or vaginal discharge.  She denies fevers, difficulty urinating, loss of bladder control, loss of bowel control, back injury, falls, numbness, tingling, weakness, trouble ambulating, abdominal pain, nausea, vomiting or diarrhea.   The history is provided by the patient and medical records. No language interpreter was used.  Back Pain   Associated symptoms include dysuria. Pertinent negatives include no chest pain, no fever, no numbness, no headaches and no abdominal pain.    Past Medical History:  Diagnosis Date  . Anxiety   . Depression     Patient Active Problem List   Diagnosis Date Noted  . Anemia 04/23/2015  . Vaginal delivery 04/22/2015  . Gestational hypertension 04/21/2015  . Cramping affecting pregnancy, antepartum 03/24/2015  . History of depression 03/24/2015  . History of anxiety 03/24/2015  . Rh negative state in antepartum period 08/23/2014    Past Surgical History:  Procedure Laterality Date  . NO PAST SURGERIES      OB  History    Gravida Para Term Preterm AB Living   1 1 1     1    SAB TAB Ectopic Multiple Live Births         0 1       Home Medications    Prior to Admission medications   Medication Sig Start Date End Date Taking? Authorizing Provider  acetaminophen (TYLENOL) 500 MG tablet Take 1,000 mg by mouth every 6 (six) hours as needed for mild pain or headache.     [provider]  ferrous sulfate 325 (65 FE) MG tablet Take 1 tablet (325 mg total) by mouth 2 (two) times daily with a meal. 04/23/15   Nigel Bridgeman, CNM  naproxen (NAPROSYN) 500 MG tablet Take 1 tablet (500 mg total) by mouth 2 (two) times daily with a meal. 09/01/17   Everlene Farrier, PA-C  norethindrone (ORTHO MICRONOR) 0.35 MG tablet Take 1 tablet (0.35 mg total) by mouth daily. 05/18/15   Nigel Bridgeman, CNM  Prenatal Vit-Fe Fumarate-FA (PRENATAL MULTIVITAMIN) TABS tablet Take 2 tablets by mouth daily at 12 noon.     [provider]  ranitidine (ZANTAC) 150 MG tablet Take 1 tablet (150 mg total) by mouth 2 (two) times daily. Patient taking differently: Take 150 mg by mouth 2 (two) times daily as needed for heartburn.  12/21/14   Nigel Bridgeman, CNM    Family History Family History  Problem Relation Age of Onset  .  Alcohol abuse Father        inpt tx 2007  . Suicidality Father        2007 inpt tx    Social History Social History   Tobacco Use  . Smoking status: Current Every Day Smoker    Packs/day: 0.50    Types: Cigarettes  . Smokeless tobacco: Former Engineer, waterUser  Substance Use Topics  . Alcohol use: Yes    Comment: socially  . Drug use: No     Allergies   Patient has no known allergies.   Review of Systems Review of Systems  Constitutional: Negative for chills and fever.  HENT: Negative for congestion and sore throat.   Eyes: Negative for visual disturbance.  Respiratory: Negative for cough and shortness of breath.   Cardiovascular: Negative for chest pain.  Gastrointestinal: Negative for  abdominal pain, diarrhea, nausea and vomiting.  Genitourinary: Positive for dysuria. Negative for decreased urine volume, difficulty urinating, flank pain, frequency, urgency, vaginal bleeding and vaginal discharge.  Musculoskeletal: Positive for back pain. Negative for gait problem and neck pain.  Skin: Negative for rash.  Neurological: Negative for syncope, light-headedness, numbness and headaches.     Physical Exam Updated Vital Signs BP 112/71 (BP Location: Right Arm)   Pulse 88   Temp 98.3 F (36.8 C) (Oral)   Resp 18   Ht 5\' 5"  (1.651 m)   Wt 74.8 kg (165 lb)   LMP 08/06/2017 (Exact Date)   SpO2 100%   Breastfeeding? No Comment: patient shielded  BMI 27.46 kg/m   Physical Exam  Constitutional: She is oriented to person, place, and time. She appears well-developed and well-nourished. No distress.  Nontoxic appearing.  HENT:  Head: Normocephalic and atraumatic.  Mouth/Throat: Oropharynx is clear and moist.  Eyes: Conjunctivae are normal. Pupils are equal, round, and reactive to light. Right eye exhibits no discharge. Left eye exhibits no discharge.  Neck: Neck supple.  Cardiovascular: Normal rate, regular rhythm, normal heart sounds and intact distal pulses. Exam reveals no gallop and no friction rub.  No murmur heard. Bilateral radial pulses are intact and equal.   Pulmonary/Chest: Effort normal and breath sounds normal. No respiratory distress. She has no wheezes. She has no rales. She exhibits tenderness.  Lungs are clear to ascultation bilaterally. Symmetric chest expansion bilaterally. No increased work of breathing. No rales or rhonchi.   Tenderness to her need thoracic spine at the midline that extends to the right with palpation of right rib. No crepitus. No overlying skin changes.   Abdominal: Soft. There is no tenderness.  Musculoskeletal: Normal range of motion. She exhibits tenderness. She exhibits no edema or deformity.  Patient has tenderness to the midline of  her back around the area of her bra strap that extends to the right with palpation of a rib. No left chest tenderness. No overlying skin changes. Good strength to her bilateral upper and lower extremities.   Lymphadenopathy:    She has no cervical adenopathy.  Neurological: She is alert and oriented to person, place, and time. She displays normal reflexes. No sensory deficit. She exhibits normal muscle tone. Coordination normal.  Skin: Skin is warm and dry. Capillary refill takes less than 2 seconds. No rash noted. She is not diaphoretic. No erythema. No pallor.  Psychiatric: She has a normal mood and affect. Her behavior is normal.  Nursing note and vitals reviewed.    ED Treatments / Results  Labs (all labs ordered are listed, but only abnormal results are displayed)  Labs Reviewed  URINALYSIS, ROUTINE W REFLEX MICROSCOPIC - Abnormal; Notable for the following components:      Result Value   APPearance HAZY (*)    All other components within normal limits  POC URINE PREG, ED    EKG  EKG Interpretation None       Radiology Dg Chest 2 View  Result Date: 09/01/2017 CLINICAL DATA:  Mid back pain starting at 1830 hours today. EXAM: CHEST  2 VIEW COMPARISON:  08/01/2016 FINDINGS: The heart size and mediastinal contours are within normal limits. Both lungs are clear. The visualized skeletal structures are unremarkable. IMPRESSION: No active cardiopulmonary disease. No acute osseous abnormality to account for the patient's back pain. Electronically Signed   By: Tollie Eth M.D.   On: 09/01/2017 22:30    Procedures Procedures (including critical care time)  Medications Ordered in ED Medications  ketorolac (TORADOL) 30 MG/ML injection 30 mg (30 mg Intramuscular Given 09/01/17 2133)     Initial Impression / Assessment and Plan / ED Course  I have reviewed the triage vital signs and the nursing notes.  Pertinent labs & imaging results that were available during my care of the  patient were reviewed by me and considered in my medical decision making (see chart for details).    This is a 21 y.o. Female who presents to the ED complaining of mid back pain starting prior to arrival.  She reports she was driving in her car when she began having mid back pain around where her bra strap lies.  She reports her pain comes with movement and almost resolves completely without movement.  She reports it seems to radiate all the way across her mid back.  She denies any shortness of breath or chest pain.  No coughing.  No treatments attempted prior to arrival.  She denies any lower back pain.  No back injury.  Last mental cycle was 08/06/17.  She reports she is trying to get pregnant.  She also reports some dysuria ongoing for several days.  She denies vaginal bleeding or vaginal discharge.  On exam the patient is afebrile nontoxic-appearing.  Her lungs are clear to auscultation bilaterally.  Symmetric chest expansion bilaterally.  She has tenderness to the right of her mid back that extends along her rib line with palpation.  No overlying skin changes.  No crepitus or deformity. Urinalysis is without sign of infection.  Pregnancy test is negative.  Chest x-ray is unremarkable. At reevaluation patient is feeling much better after Toradol in the emergency department.  She has only slight pain and only with movement.  With she has had no shortness of breath.  Question possible rib subluxation on right causing pain?  Workup here is reassuring.  Will discharge with a course of naproxen and have her follow-up closely with primary care.  Return precautions discussed. I advised the patient to follow-up with their primary care provider this week. I advised the patient to return to the emergency department with new or worsening symptoms or new concerns. The patient verbalized understanding and agreement with plan.    This patient was discussed with Dr. Lynelle Doctor who agrees with assessment and plan.   Final  Clinical Impressions(s) / ED Diagnoses   Final diagnoses:  Mid back pain on right side    ED Discharge Orders        Ordered    naproxen (NAPROSYN) 500 MG tablet  2 times daily with meals     09/01/17 2318  Everlene FarrierDansie, Ajia Chadderdon, PA-C 09/01/17 Janetta [id number]    Linwood DibblesKnapp, Jon, MD 09/01/17 [phone]

## 2017-09-01 NOTE — ED Triage Notes (Addendum)
Pt endorses mid back pain that began around 1830 while driving. Denies injury or shob. VSS. Breath sounds clear. Denies urinary sx. Much worse with movement.

## 2017-09-06 ENCOUNTER — Emergency Department (HOSPITAL_COMMUNITY)
Admission: EM | Admit: 2017-09-06 | Discharge: 2017-09-06 | Disposition: A | Discharge status: Home / Self Care | Payer: Medicaid Other | Attending: Emergency Medicine | Admitting: Emergency Medicine

## 2017-09-06 ENCOUNTER — Encounter (HOSPITAL_COMMUNITY): Payer: Self-pay

## 2017-09-06 ENCOUNTER — Other Ambulatory Visit: Payer: Self-pay

## 2017-09-06 DIAGNOSIS — F17210 Nicotine dependence, cigarettes, uncomplicated: Secondary | ICD-10-CM | POA: Insufficient documentation

## 2017-09-06 DIAGNOSIS — H6983 Other specified disorders of Eustachian tube, bilateral: Secondary | ICD-10-CM

## 2017-09-06 DIAGNOSIS — Z79899 Other long term (current) drug therapy: Secondary | ICD-10-CM | POA: Insufficient documentation

## 2017-09-06 DIAGNOSIS — F419 Anxiety disorder, unspecified: Secondary | ICD-10-CM | POA: Insufficient documentation

## 2017-09-06 DIAGNOSIS — H6993 Unspecified Eustachian tube disorder, bilateral: Secondary | ICD-10-CM | POA: Insufficient documentation

## 2017-09-06 DIAGNOSIS — J029 Acute pharyngitis, unspecified: Secondary | ICD-10-CM

## 2017-09-06 DIAGNOSIS — F329 Major depressive disorder, single episode, unspecified: Secondary | ICD-10-CM | POA: Insufficient documentation

## 2017-09-06 DIAGNOSIS — J00 Acute nasopharyngitis [common cold]: Secondary | ICD-10-CM

## 2017-09-06 LAB — RAPID STREP SCREEN (MED CTR MEBANE ONLY): STREPTOCOCCUS, GROUP A SCREEN (DIRECT): NEGATIVE

## 2017-09-06 MED ORDER — CETIRIZINE HCL 10 MG PO TABS: 10.0000 mg | ORAL_TABLET | Freq: Every day | ORAL | 1 refills | Status: DC

## 2017-09-06 MED ORDER — FLUTICASONE PROPIONATE 50 MCG/ACT NA SUSP: 2.0000 | Freq: Every day | NASAL | 0 refills | Status: DC

## 2017-09-06 NOTE — ED Notes (Signed)
Pt stable, ambulatory, and verbalizes understanding of d/c instructions.  

## 2017-09-06 NOTE — ED Triage Notes (Signed)
Pt states she has sore throat, only on the right side X4 days. Pt also reports feeling like she has some hearing loss as well. Pt alert and oriented. Skin warm and dry. Airway intact.

## 2017-09-06 NOTE — ED Provider Notes (Signed)
[facility] Provider Note   CSN: [id number] Arrival date & time: 09/06/17  1212     History   Chief Complaint Chief Complaint  Patient presents with  . Sore Throat    HPI Judith Osborn is a 21 y.o. female.  Judith Osborn is a 21 y.o. Female who presents to the emergency department complaining of several days of sneezing, nasal congestion, postnasal drip and sore throat.  She also reports ear pressure and popping.  She is taking naproxen at home with little relief of her symptoms.  No fevers.  She reports slight cough, but no trouble breathing.  She denies fevers, wheezing, shortness of breath, chest pain, vomiting, changes to her vision or rashes.   The history is provided by the patient and medical records. No language interpreter was used.  Sore Throat  Pertinent negatives include no shortness of breath.    Past Medical History:  Diagnosis Date  . Anxiety   . Depression     Patient Active Problem List   Diagnosis Date Noted  . Anemia 04/23/2015  . Vaginal delivery 04/22/2015  . Gestational hypertension 04/21/2015  . Cramping affecting pregnancy, antepartum 03/24/2015  . History of depression 03/24/2015  . History of anxiety 03/24/2015  . Rh negative state in antepartum period 08/23/2014    Past Surgical History:  Procedure Laterality Date  . NO PAST SURGERIES      OB History    Gravida Para Term Preterm AB Living   1 1 1     1    SAB TAB Ectopic Multiple Live Births         0 1       Home Medications    Prior to Admission medications   Medication Sig Start Date End Date Taking? Authorizing Provider  acetaminophen (TYLENOL) 500 MG tablet Take 1,000 mg by mouth every 6 (six) hours as needed for mild pain or headache.     [provider]  cetirizine (ZYRTEC ALLERGY) 10 MG tablet Take 1 tablet (10 mg total) by mouth daily. 09/06/17   Everlene Farrieransie, Taven Strite, PA-C  ferrous sulfate 325 (65 FE) MG tablet Take 1  tablet (325 mg total) by mouth 2 (two) times daily with a meal. 04/23/15   Nigel BridgemanLatham, Vicki, CNM  fluticasone [facility](FLONASE) 50 MCG/ACT nasal spray Place 2 sprays into both nostrils daily. 09/06/17   Everlene Farrieransie, Taylie Helder, PA-C  naproxen (NAPROSYN) 500 MG tablet Take 1 tablet (500 mg total) by mouth 2 (two) times daily with a meal. 09/01/17   Everlene Farrieransie, Azharia Surratt, PA-C  norethindrone (ORTHO MICRONOR) 0.35 MG tablet Take 1 tablet (0.35 mg total) by mouth daily. 05/18/15   Nigel BridgemanLatham, Vicki, CNM  Prenatal Vit-Fe Fumarate-FA (PRENATAL MULTIVITAMIN) TABS tablet Take 2 tablets by mouth daily at 12 noon.     [provider]  ranitidine (ZANTAC) 150 MG tablet Take 1 tablet (150 mg total) by mouth 2 (two) times daily. Patient taking differently: Take 150 mg by mouth 2 (two) times daily as needed for heartburn.  12/21/14   Nigel BridgemanLatham, Vicki, CNM    Family History Family History  Problem Relation Age of Onset  . Alcohol abuse Father        inpt tx 2007  . Suicidality Father        2007 inpt tx    Social History Social History   Tobacco Use  . Smoking status: Current Every Day Smoker    Packs/day: 0.50    Types: Cigarettes  .  Smokeless tobacco: Former Engineer, waterUser  Substance Use Topics  . Alcohol use: Yes    Comment: socially  . Drug use: No     Allergies   Patient has no known allergies.   Review of Systems Review of Systems  Constitutional: Negative for chills and fever.  HENT: Positive for congestion, ear pain, postnasal drip, rhinorrhea, sneezing and sore throat. Negative for ear discharge, facial swelling, hearing loss, trouble swallowing and voice change.   Eyes: Negative for visual disturbance.  Respiratory: Positive for cough. Negative for shortness of breath and wheezing.   Skin: Negative for rash.     Physical Exam Updated Vital Signs BP 115/72 (BP Location: Right Arm)   Pulse 73   Temp 98.3 F (36.8 C)   Resp 19   LMP 08/06/2017 (Exact Date)   SpO2 99%   Physical Exam  Constitutional: She  appears well-developed and well-nourished.  Non-toxic appearance. She does not appear ill. No distress.  HENT:  Head: Normocephalic and atraumatic.  Right Ear: A middle ear effusion is present.  Left Ear: A middle ear effusion is present.  Mouth/Throat: Uvula is midline. No uvula swelling. Posterior oropharyngeal erythema present. No oropharyngeal exudate, posterior oropharyngeal edema or tonsillar abscesses. Tonsils are 0 on the right. Tonsils are 0 on the left. No tonsillar exudate.  Mild to moderate clear middle ear effusion noted bilaterally without TM erythema or loss of landmarks. Rhinorrhea and boggy nasal turbinates noted bilaterally. Mild posterior oropharyngeal erythema with evidence of postnasal drip.  No oropharyngeal edema or tonsillar exudate.  No peritonsillar abscess.  Eyes: Conjunctivae are normal. Pupils are equal, round, and reactive to light. Right eye exhibits no discharge. Left eye exhibits no discharge.  Neck: Neck supple.  Cardiovascular: Normal rate, regular rhythm, normal heart sounds and intact distal pulses.  Pulmonary/Chest: Effort normal and breath sounds normal. No stridor. No respiratory distress. She has no wheezes. She has no rhonchi. She has no rales.  Lungs are clear to ascultation bilaterally. Symmetric chest expansion bilaterally. No increased work of breathing. No rales or rhonchi.    Abdominal: Soft. There is no tenderness.  Lymphadenopathy:    She has no cervical adenopathy.  Neurological: She is alert. Coordination normal.  Skin: Skin is warm and dry. No rash noted. She is not diaphoretic.  Psychiatric: She has a normal mood and affect. Her behavior is normal.  Nursing note and vitals reviewed.    ED Treatments / Results  Labs (all labs ordered are listed, but only abnormal results are displayed) Labs Reviewed  RAPID STREP SCREEN (NOT AT [facility])  CULTURE, GROUP A STREP [facility])    EKG  EKG Interpretation None       Radiology No results  found.  Procedures Procedures (including critical care time)  Medications Ordered in ED Medications - No data to display   Initial Impression / Assessment and Plan / ED Course  I have reviewed the triage vital signs and the nursing notes.  Pertinent labs & imaging results that were available during my care of the patient were reviewed by me and considered in my medical decision making (see chart for details).     This is a 21 y.o. Female who presents to the emergency department complaining of several days of sneezing, nasal congestion, postnasal drip and sore throat.  She also reports ear pressure and popping.  She is taking naproxen at home with little relief of her symptoms.  No fevers.  She reports slight cough, but no trouble breathing.  On exam the patient is afebrile nontoxic-appearing.  She has rhinorrhea present.  Mild posterior oropharyngeal erythema without edema or tonsillar hypertrophy or exudates.  No drooling.  No trismus.  She is a mild clear middle ear effusion bilaterally without TM erythema or loss of landmarks.  Rapid strep test is negative.  Patient has upper respiratory infection and eustachian tube dysfunction.  Will start on Flonase and Zyrtec and she can continue using naproxen as needed at home. I advised the patient to follow-up with their primary care provider this week. I advised the patient to return to the emergency department with new or worsening symptoms or new concerns. The patient verbalized understanding and agreement with plan.     Final Clinical Impressions(s) / ED Diagnoses   Final diagnoses:  Viral pharyngitis  Dysfunction of both eustachian tubes  Acute nasopharyngitis    ED Discharge Orders        Ordered    fluticasone (FLONASE) 50 MCG/ACT nasal spray  Daily     09/06/17 1423    cetirizine (ZYRTEC ALLERGY) 10 MG tablet  Daily     09/06/17 1423       Everlene Farrier, PA-C 09/06/17 1427    Loren Racer, MD 09/10/17 1123

## 2017-09-08 LAB — CULTURE, GROUP A STREP (THRC)

## 2017-09-09 ENCOUNTER — Telehealth: Payer: Self-pay | Admitting: *Deleted

## 2017-09-09 NOTE — Telephone Encounter (Signed)
Post ED Visit - Positive Culture Follow-up: Successful Patient Follow-Up  Culture assessed and recommendations reviewed by: []  Enzo BiNathan Batchelder, Pharm.D. []  Celedonio MiyamotoJeremy Frens, Pharm.D., BCPS AQ-ID []  Garvin FilaMike Maccia, Pharm.D., BCPS []  Georgina PillionElizabeth Martin, Pharm.D., BCPS []  RisonMinh Pham, [address]Pharm.D., BCPS, AAHIVP []  Estella HuskMichelle Turner, Pharm.D., BCPS, AAHIVP []  Lysle Pearlachel Rumbarger, PharmD, BCPS []  Casilda Carlsaylor Stone, PharmD, BCPS []  Pollyann SamplesAndy Johnston, PharmD, BCPS  Positive strep culture  [x]  Patient discharged without antimicrobial prescription and treatment is now indicated []  Organism is resistant to prescribed ED discharge antimicrobial []  Patient with positive blood cultures  Changes discussed with ED provider Glenna DurandSamantha R. Petrocelli, PA-C New antibiotic prescription Amoxicillin 500mg  PO BID x 10 days Spoke with patient who declined prescription stating she had Amoxicillin left over from her last episode with strep throat.  Advised patient of need to complete full prescription to ensure strep throat is cleared, however she again declined prescription stating she would take what she had at home.     Virl AxeRobertson, Monserrat Vidaurri [facility] 09/09/2017, 10:42 AM

## 2017-09-09 NOTE — Progress Notes (Signed)
ED Antimicrobial Stewardship Positive Culture Follow Up  Isa RankinMaryssa R Wyrick is an 21 y.o. female who [facility] with a chief complaint of   Chief Complaint  Patient presents with  . Sore Throat   ? Recent Results (from the past 720 hour(s))  Rapid strep screen     Status: Abnormal   Collection Time: 08/18/17  8:23 AM  Result Value Ref Range Status   Streptococcus, Group A Screen (Direct) POSITIVE (A) NEGATIVE Final  Rapid strep screen     Status: None   Collection Time: 09/06/17 12:28 PM  Result Value Ref Range Status   Streptococcus, Group A Screen (Direct) NEGATIVE NEGATIVE Final    Comment: DELTA CHECK NOTED (NOTE) A Rapid Antigen test may result negative if the antigen level in the sample is below the detection level of this test. The FDA has not cleared this test as a stand-alone test therefore the rapid antigen negative result has reflexed to a Group A Strep culture.   Culture, group A strep     Status: None   Collection Time: 09/06/17 12:28 PM  Result Value Ref Range Status   Specimen Description THROAT  Final   Special Requests NONE Reflexed from [id number]  Final   Culture MODERATE GROUP A STREP (S.PYOGENES) ISOLATED  Final   Report Status 09/08/2017 FINAL  Final   ? Patient discharged originally without antimicrobial agent and treatment is now indicated ? New antibiotic prescription: Amoxicillin 500 mg PO BID for 10 days  ? ED Provider: Harvie HeckSamantha Petrucelli PA-C ? Sheron NightingaleJames A Jalon Blackwelder 09/09/2017, 10:16 AM Infectious Diseases Pharmacist Phone# [phone]

## 2017-10-24 ENCOUNTER — Encounter (HOSPITAL_COMMUNITY): Payer: Self-pay | Admitting: Emergency Medicine

## 2017-10-24 ENCOUNTER — Emergency Department (HOSPITAL_COMMUNITY)
Admission: EM | Admit: 2017-10-24 | Discharge: 2017-10-25 | Disposition: A | Discharge status: Home / Self Care | Payer: Medicaid Other | Attending: Emergency Medicine | Admitting: Emergency Medicine

## 2017-10-24 ENCOUNTER — Other Ambulatory Visit: Payer: Self-pay

## 2017-10-24 DIAGNOSIS — Z79899 Other long term (current) drug therapy: Secondary | ICD-10-CM | POA: Insufficient documentation

## 2017-10-24 DIAGNOSIS — F17210 Nicotine dependence, cigarettes, uncomplicated: Secondary | ICD-10-CM | POA: Insufficient documentation

## 2017-10-24 DIAGNOSIS — L600 Ingrowing nail: Secondary | ICD-10-CM | POA: Insufficient documentation

## 2017-10-24 MED ORDER — LIDOCAINE HCL (PF) 1 % IJ SOLN
10.0000 mL | Freq: Once | INTRAMUSCULAR | Status: AC
  Administered 2017-10-24: 10 mL
  Filled 2017-10-24: qty 10

## 2017-10-24 NOTE — ED Triage Notes (Signed)
Pt c/o ingrown toenail to right great toe. Mild swelling and redness noted. Afebrile.

## 2017-10-25 MED ORDER — DOXYCYCLINE HYCLATE 100 MG PO CAPS: 100.0000 mg | ORAL_CAPSULE | Freq: Two times a day (BID) | ORAL | 0 refills | Status: DC

## 2017-10-25 NOTE — Discharge Instructions (Signed)
Take antibiotics as prescribed. Take the entire course, even if your symptoms improve.  Use tylenol or ibuprofen as needed for pain. Wash the area daily with soap and water. Reapply dressing if it is still bleeding.  Return to the ER if you develop fevers, chills, nausea, vomiting, pus draining, or any new or worsening symptoms.

## 2017-10-25 NOTE — ED Provider Notes (Signed)
[facility] EMERGENCY DEPARTMENT Provider Note   CSN: [id number] Arrival date & time: 10/24/17  2153     History   Chief Complaint Chief Complaint  Patient presents with  . Ingrown Toenail    HPI Judith Osborn is a 22 y.o. female presenting for evaluation of R toe pain and swelling.   Patient states she is a frequent history of ingrown toenails.  She started to develop symptoms of an ingrown toenail on her right toe last night.  Her 71-year-old daughter subsequently stomped on her toe.  Since then, she has had worsening pain and swelling.  She denies injury of the toe.  She denies numbness or tingling.  She denies other medical problems, does not take medications daily.  She is not on blood thinners.  Tetanus was updated 2 years ago.  She has not taken anything for pain including Tylenol or ibuprofen.  Pain is constant, walking makes it worse.  Nothing makes it better.  It does not radiate.  It is sharp and throbbing.   HPI  Past Medical History:  Diagnosis Date  . Anxiety   . Depression     Patient Active Problem List   Diagnosis Date Noted  . Anemia 04/23/2015  . Vaginal delivery 04/22/2015  . Gestational hypertension 04/21/2015  . Cramping affecting pregnancy, antepartum 03/24/2015  . History of depression 03/24/2015  . History of anxiety 03/24/2015  . Rh negative state in antepartum period 08/23/2014    Past Surgical History:  Procedure Laterality Date  . NO PAST SURGERIES      OB History    Gravida Para Term Preterm AB Living   1 1 1     1    SAB TAB Ectopic Multiple Live Births         0 1       Home Medications    Prior to Admission medications   Medication Sig Start Date End Date Taking? Authorizing Provider  acetaminophen (TYLENOL) 500 MG tablet Take 1,000 mg by mouth every 6 (six) hours as needed for mild pain or headache.     [provider]  cetirizine (ZYRTEC ALLERGY) 10 MG tablet Take 1 tablet (10 mg total) by mouth  daily. 09/06/17   Everlene Farrier, PA-C  doxycycline (VIBRAMYCIN) 100 MG capsule Take 1 capsule (100 mg total) by mouth 2 (two) times daily. 10/25/17   Anahla Bevis, PA-C  ferrous sulfate 325 (65 FE) MG tablet Take 1 tablet (325 mg total) by mouth 2 (two) times daily with a meal. 04/23/15   Nigel Bridgeman, CNM  fluticasone [facility]) 50 MCG/ACT nasal spray Place 2 sprays into both nostrils daily. 09/06/17   Everlene Farrier, PA-C  naproxen (NAPROSYN) 500 MG tablet Take 1 tablet (500 mg total) by mouth 2 (two) times daily with a meal. 09/01/17   Everlene Farrier, PA-C  norethindrone (ORTHO MICRONOR) 0.35 MG tablet Take 1 tablet (0.35 mg total) by mouth daily. 05/18/15   Nigel Bridgeman, CNM  Prenatal Vit-Fe Fumarate-FA (PRENATAL MULTIVITAMIN) TABS tablet Take 2 tablets by mouth daily at 12 noon.     [provider]  ranitidine (ZANTAC) 150 MG tablet Take 1 tablet (150 mg total) by mouth 2 (two) times daily. Patient taking differently: Take 150 mg by mouth 2 (two) times daily as needed for heartburn.  12/21/14   Nigel Bridgeman, CNM    Family History Family History  Problem Relation Age of Onset  . Alcohol abuse Father  inpt tx 2007  . Suicidality Father        2007 inpt tx    Social History Social History   Tobacco Use  . Smoking status: Current Every Day Smoker    Packs/day: 0.50    Types: Cigarettes  . Smokeless tobacco: Former Engineer, water Use Topics  . Alcohol use: Yes    Comment: socially  . Drug use: No     Allergies   Patient has no known allergies.   Review of Systems Review of Systems  Constitutional: Negative for chills and fever.  Skin:       R toe pain/ingrown toe nail  Hematological: Does not bruise/bleed easily.     Physical Exam Updated Vital Signs BP 130/89 (BP Location: Right Arm)   Pulse 88   Temp 98.7 F (37.1 C)   Resp 16   Ht 5\' 5"  (1.651 m)   Wt 77.1 kg (170 lb)   LMP 10/16/2017   SpO2 98%   BMI 28.29 kg/m   Physical Exam    Constitutional: She is oriented to person, place, and time. She appears well-developed and well-nourished. No distress.  HENT:  Head: Normocephalic and atraumatic.  Eyes: EOM are normal.  Neck: Normal range of motion.  Pulmonary/Chest: Effort normal.  Abdominal: She exhibits no distension.  Musculoskeletal: Normal range of motion.  Neurological: She is alert and oriented to person, place, and time.  Skin: Skin is warm. No rash noted.  R great toe with ingrown toenail, and erythema and edema of surrounding skin. No drainage. Sensation intact. Pedal pulses intact bilaterally. No injury or tenderness elsewhere on the foot.   Psychiatric: She has a normal mood and affect.  Nursing note and vitals reviewed.    ED Treatments / Results  Labs (all labs ordered are listed, but only abnormal results are displayed) Labs Reviewed - No data to display  EKG  EKG Interpretation None       Radiology No results found.  Procedures Excise ingrown toenail Date/Time: 10/25/2017 12:26 AM Performed by: Alveria Apley, PA-C Authorized by: Alveria Apley, PA-C  Consent: Verbal consent obtained. Risks and benefits: risks, benefits and alternatives were discussed Consent given by: patient Local anesthesia used: yes Anesthesia: digital block  Anesthesia: Local anesthesia used: yes Local Anesthetic: lidocaine 1% without epinephrine Anesthetic total: 8 mL  Sedation: Patient sedated: no  Patient tolerance: Patient tolerated the procedure well with no immediate complications    (including critical care time)  Medications Ordered in ED Medications  lidocaine (PF) (XYLOCAINE) 1 % injection 10 mL (10 mLs Infiltration Given by Other 10/24/17 2347)     Initial Impression / Assessment and Plan / ED Course  I have reviewed the triage vital signs and the nursing notes.  Pertinent labs & imaging results that were available during my care of the patient were reviewed by me and considered  in my medical decision making (see chart for details).     Patient presenting for evaluation of ingrown toenail.  Physical exam shows right great toe with ingrown toenail and surrounding skin erythema and edema.  No signs of systemic infection.  Tetanus is up-to-date.  Right great toe digital block to perform ingrown nail partial excision.  Patient tolerated well.  Aftercare instructions given.  Patient requesting postop shoe.  We will placed on antibiotics to ensure no infection.  At this time, patient appears safe for discharge.  Return precautions given.  Patient states she understands and agrees to plan.   Final  Clinical Impressions(s) / ED Diagnoses   Final diagnoses:  Ingrown right big toenail    ED Discharge Orders        Ordered    doxycycline (VIBRAMYCIN) 100 MG capsule  2 times daily     10/25/17 0024       Anoushka Divito, PA-C 10/25/17 0032    Mancel BaleWentz, Elliott, MD 10/25/17 1152

## 2017-12-02 ENCOUNTER — Emergency Department (HOSPITAL_COMMUNITY)
Admission: EM | Admit: 2017-12-02 | Discharge: 2017-12-02 | Disposition: A | Discharge status: Home / Self Care | Payer: Self-pay | Attending: Emergency Medicine | Admitting: Emergency Medicine

## 2017-12-02 ENCOUNTER — Encounter (HOSPITAL_COMMUNITY): Payer: Self-pay | Admitting: *Deleted

## 2017-12-02 ENCOUNTER — Emergency Department (HOSPITAL_COMMUNITY): Payer: Self-pay

## 2017-12-02 DIAGNOSIS — J069 Acute upper respiratory infection, unspecified: Secondary | ICD-10-CM | POA: Insufficient documentation

## 2017-12-02 DIAGNOSIS — F17210 Nicotine dependence, cigarettes, uncomplicated: Secondary | ICD-10-CM | POA: Insufficient documentation

## 2017-12-02 LAB — RAPID STREP SCREEN (MED CTR MEBANE ONLY): STREPTOCOCCUS, GROUP A SCREEN (DIRECT): NEGATIVE

## 2017-12-02 MED ORDER — DEXAMETHASONE 10 MG/ML FOR PEDIATRIC ORAL USE
10.0000 mg | Freq: Once | INTRAMUSCULAR | Status: AC
  Administered 2017-12-02: 10 mg via ORAL
  Filled 2017-12-02: qty 1

## 2017-12-02 MED ORDER — DEXAMETHASONE SODIUM PHOSPHATE 10 MG/ML IJ SOLN
INTRAMUSCULAR | Status: AC
  Filled 2017-12-02: qty 1

## 2017-12-02 MED ORDER — BENZONATATE 100 MG PO CAPS: 100.0000 mg | ORAL_CAPSULE | Freq: Three times a day (TID) | ORAL | 0 refills | Status: DC

## 2017-12-02 NOTE — ED Triage Notes (Signed)
Pt in c/o sore throat and cough since last week, denies fever, had negative strep at PCP

## 2017-12-02 NOTE — ED Provider Notes (Signed)
[facility] EMERGENCY DEPARTMENT Provider Note   CSN: [id number] Arrival date & time: 12/02/17  1539     History   Chief Complaint Chief Complaint  Patient presents with  . Sore Throat  . Cough    HPI Judith Osborn is a 22 y.o. female.  HPI Judith Osborn is a 22 y.o. female presents to emergency department complaint of cough and sore throat.  Patient states she has had symptoms on and off for the last 2 months.  She states initially she was diagnosed with strep throat and took amoxicillin.  She states her throat somewhat improved.  She states she then was seen by her doctor about a month ago diagnosed with pneumonia.  She was given antibiotics but states they were $90 and she could not afford them.  She states that she did not take the antibiotics for the pneumonia.  She states that about a week ago she developed again sore throat and cough.  She states last night she has had posttussive emesis.  She has not tried any medications prior to coming in.  She denies any fever or chills.  No shortness of breath.  No hemoptysis.  She denies any changes in her voice.  No difficulty swallowing.  No chest pain or abdominal pain.  No other complaints.  Past Medical History:  Diagnosis Date  . Anxiety   . Depression     Patient Active Problem List   Diagnosis Date Noted  . Anemia 04/23/2015  . Vaginal delivery 04/22/2015  . Gestational hypertension 04/21/2015  . Cramping affecting pregnancy, antepartum 03/24/2015  . History of depression 03/24/2015  . History of anxiety 03/24/2015  . Rh negative state in antepartum period 08/23/2014    Past Surgical History:  Procedure Laterality Date  . NO PAST SURGERIES      OB History    Gravida Para Term Preterm AB Living   1 1 1     1    SAB TAB Ectopic Multiple Live Births         0 1       Home Medications    Prior to Admission medications   Medication Sig Start Date End Date Taking? Authorizing Provider    acetaminophen (TYLENOL) 500 MG tablet Take 1,000 mg by mouth every 6 (six) hours as needed for mild pain or headache.     [provider]  cetirizine (ZYRTEC ALLERGY) 10 MG tablet Take 1 tablet (10 mg total) by mouth daily. 09/06/17   Everlene Farrier, PA-C  doxycycline (VIBRAMYCIN) 100 MG capsule Take 1 capsule (100 mg total) by mouth 2 (two) times daily. 10/25/17   Caccavale, Sophia, PA-C  ferrous sulfate 325 (65 FE) MG tablet Take 1 tablet (325 mg total) by mouth 2 (two) times daily with a meal. 04/23/15   Nigel Bridgeman, CNM  fluticasone [facility]) 50 MCG/ACT nasal spray Place 2 sprays into both nostrils daily. 09/06/17   Everlene Farrier, PA-C  naproxen (NAPROSYN) 500 MG tablet Take 1 tablet (500 mg total) by mouth 2 (two) times daily with a meal. 09/01/17   Everlene Farrier, PA-C  norethindrone (ORTHO MICRONOR) 0.35 MG tablet Take 1 tablet (0.35 mg total) by mouth daily. 05/18/15   Nigel Bridgeman, CNM  Prenatal Vit-Fe Fumarate-FA (PRENATAL MULTIVITAMIN) TABS tablet Take 2 tablets by mouth daily at 12 noon.     [provider]  ranitidine (ZANTAC) 150 MG tablet Take 1 tablet (150 mg total) by mouth 2 (two) times daily. Patient  taking differently: Take 150 mg by mouth 2 (two) times daily as needed for heartburn.  12/21/14   Nigel BridgemanLatham, Vicki, CNM    Family History Family History  Problem Relation Age of Onset  . Alcohol abuse Father        inpt tx 2007  . Suicidality Father        2007 inpt tx    Social History Social History   Tobacco Use  . Smoking status: Current Every Day Smoker    Packs/day: 0.50    Types: Cigarettes  . Smokeless tobacco: Former Engineer, waterUser  Substance Use Topics  . Alcohol use: Yes    Comment: socially  . Drug use: No     Allergies   Patient has no known allergies.   Review of Systems Review of Systems  Constitutional: Negative for chills and fever.  HENT: Positive for sore throat. Negative for congestion, trouble swallowing and voice change.    Respiratory: Positive for cough. Negative for chest tightness and shortness of breath.   Cardiovascular: Negative for chest pain, palpitations and leg swelling.  Gastrointestinal: Negative for abdominal pain, diarrhea, nausea and vomiting.  Genitourinary: Negative for dysuria, flank pain and pelvic pain.  Musculoskeletal: Negative for arthralgias, myalgias, neck pain and neck stiffness.  Skin: Negative for rash.  Neurological: Negative for dizziness, weakness and headaches.  All other systems reviewed and are negative.    Physical Exam Updated Vital Signs BP 117/68 (BP Location: Right Arm)   Pulse 80   Temp 98.2 F (36.8 C) (Oral)   Resp 18   LMP 12/02/2017   SpO2 100%   Physical Exam  Constitutional: She appears well-developed and well-nourished. No distress.  HENT:  Head: Normocephalic.  Oropharynx erythematous, uvula midline, no exudate  Eyes: Conjunctivae are normal.  Neck: Normal range of motion. Neck supple.  Cardiovascular: Normal rate, regular rhythm and normal heart sounds.  Pulmonary/Chest: Effort normal and breath sounds normal. No respiratory distress. She has no wheezes. She has no rales.  Abdominal: Soft. Bowel sounds are normal. She exhibits no distension. There is no tenderness. There is no rebound.  Musculoskeletal: She exhibits no edema.  Lymphadenopathy:    She has no cervical adenopathy.  Neurological: She is alert.  Skin: Skin is warm and dry.  Psychiatric: She has a normal mood and affect. Her behavior is normal.  Nursing note and vitals reviewed.    ED Treatments / Results  Labs (all labs ordered are listed, but only abnormal results are displayed) Labs Reviewed  RAPID STREP SCREEN (NOT AT [facility])  CULTURE, GROUP A STREP [facility])    EKG  EKG Interpretation None       Radiology No results found.  Procedures Procedures (including critical care time)  Medications Ordered in ED Medications - No data to display   Initial Impression /  Assessment and Plan / ED Course  I have reviewed the triage vital signs and the nursing notes.  Pertinent labs & imaging results that were available during my care of the patient were reviewed by me and considered in my medical decision making (see chart for details).     Patient with sore throat, cough, symptoms for last week.  States was diagnosed pneumonia last month and never took antibiotics.  Exam unremarkable, oropharynx is slightly erythematous, however tonsils are not enlarged, uvula midline, no concern for peritonsillar abscess or retropharyngeal abscess.  Patient is swallowing her own secretions.  Does not appear to be dehydrated.  Normal vital signs.  Rapid strep  obtained by triage nurse and negative.  Will get a chest x-ray  CXR negative. Pt asked for steroids. Will dive decadron. Plan to DC home with close outpatient followup. DC papers by PA Sofia  Vitals:   12/02/17 1608  BP: 117/68  Pulse: 80  Resp: 18  Temp: 98.2 F (36.8 C)  TempSrc: Oral  SpO2: 100%     Final Clinical Impressions(s) / ED Diagnoses   Final diagnoses:  Upper respiratory tract infection, unspecified type    ED Discharge Orders    None       Jaynie Crumble, PA-C 12/02/17 1714    Charlynne Pander, MD 12/05/17 [phone]

## 2017-12-02 NOTE — Discharge Instructions (Signed)
Return if any problems.  See your Physician for recheck if symptoms persist past one week  °

## 2017-12-05 LAB — CULTURE, GROUP A STREP (THRC)

## 2017-12-06 ENCOUNTER — Encounter (HOSPITAL_COMMUNITY): Payer: Self-pay | Admitting: Student

## 2017-12-06 ENCOUNTER — Other Ambulatory Visit: Payer: Self-pay

## 2017-12-06 ENCOUNTER — Emergency Department (HOSPITAL_COMMUNITY)
Admission: EM | Admit: 2017-12-06 | Discharge: 2017-12-06 | Disposition: A | Discharge status: Home / Self Care | Payer: Medicaid Other | Attending: Emergency Medicine | Admitting: Emergency Medicine

## 2017-12-06 DIAGNOSIS — Z79899 Other long term (current) drug therapy: Secondary | ICD-10-CM | POA: Insufficient documentation

## 2017-12-06 DIAGNOSIS — F17210 Nicotine dependence, cigarettes, uncomplicated: Secondary | ICD-10-CM | POA: Insufficient documentation

## 2017-12-06 DIAGNOSIS — J029 Acute pharyngitis, unspecified: Secondary | ICD-10-CM | POA: Insufficient documentation

## 2017-12-06 LAB — RAPID STREP SCREEN (MED CTR MEBANE ONLY): Streptococcus, Group A Screen (Direct): NEGATIVE

## 2017-12-06 MED ORDER — NAPROXEN 500 MG PO TABS: 500.0000 mg | ORAL_TABLET | Freq: Two times a day (BID) | ORAL | 0 refills | Status: DC

## 2017-12-06 MED ORDER — DEXAMETHASONE SODIUM PHOSPHATE 10 MG/ML IJ SOLN
10.0000 mg | Freq: Once | INTRAMUSCULAR | Status: AC
  Administered 2017-12-06: 10 mg via INTRAMUSCULAR
  Filled 2017-12-06: qty 1

## 2017-12-06 NOTE — ED Triage Notes (Signed)
Pt c/o sore throat intermittent since christmas, had flu last week, has had strep x 2 last year.

## 2017-12-06 NOTE — Discharge Instructions (Signed)
You were seen in the emergency department for a sore throat.  Your rapid strep test was negative, we will call you if your culture comes back positive. You were given a shot of Decadron to help with the pain and swelling.  We have given you a prescription for naproxen.  Naproxen is a nonsteroidal anti-inflammatory medication that will help with pain and swelling. Be sure to take this medication as prescribed with food, 1 pill every 12 hours,  It should be taken with food, as it can cause stomach upset, and more seriously, stomach bleeding. Do not take other nonsteroidal anti-inflammatory medications with this such as Advil, Motrin, or Aleve.  You may take Tylenol with this as prescribed in the over-the-counter dosing instructions.  Given this problem has been ongoing for 4 months, we recommend that she follow-up with ENT for further evaluation of this.  We have given you the contact information for an ENT doctor in the area.  Please call tomorrow in order to make an appointment for sometime this week.  May also follow-up with your primary care doctor.  Return to the emergency department for any new or worsening symptoms including but not limited to inability to open your mouth, inability to move your neck, worsening pain, change in your voice, inability to swallow your own saliva, drooling, or any other concerns.

## 2017-12-06 NOTE — ED Provider Notes (Signed)
[facility] Provider Note   CSN: [id number] Arrival date & time: 12/06/17  1112     History   Chief Complaint Chief Complaint  Patient presents with  . Sore Throat    HPI Judith Osborn is a 22 y.o. female with a hx of tobacco abuse, anxiety, and depression who presents to the ED with complaint of sore throat which has been ongoing for the past 4 months. Patient states she has had constant/persistent sore throat since 07/2017- she has had multiple ED visits and PCP visits regarding this. States she was dx with strep throat in November and received tx with amoxicillin without improvement. Other treatments have included steroids, motrin, naproxen, tylenol, flonase, zarby's, and cough drops. States that steroids help very temporarily otherwise no alleviating factors. States pain is worse with swallowing, however she is able to swallow. Current pain is a 5/10 in severity. She has not had specialist type follow up. Denies fever, chills, nausea, vomiting, cough, hemoptysis, or dyspnea.    HPI  Past Medical History:  Diagnosis Date  . Anxiety   . Depression     Patient Active Problem List   Diagnosis Date Noted  . Anemia 04/23/2015  . Vaginal delivery 04/22/2015  . Gestational hypertension 04/21/2015  . Cramping affecting pregnancy, antepartum 03/24/2015  . History of depression 03/24/2015  . History of anxiety 03/24/2015  . Rh negative state in antepartum period 08/23/2014    Past Surgical History:  Procedure Laterality Date  . NO PAST SURGERIES      OB History    Gravida Para Term Preterm AB Living   1 1 1     1    SAB TAB Ectopic Multiple Live Births         0 1       Home Medications    Prior to Admission medications   Medication Sig Start Date End Date Taking? Authorizing Provider  acetaminophen (TYLENOL) 500 MG tablet Take 1,000 mg by mouth every 6 (six) hours as needed for mild pain or headache.     [provider]  benzonatate (TESSALON) 100 MG capsule Take 1 capsule (100 mg total) by mouth every 8 (eight) hours. 12/02/17   Elson AreasSofia, Leslie K, PA-C  cetirizine (ZYRTEC ALLERGY) 10 MG tablet Take 1 tablet (10 mg total) by mouth daily. 09/06/17   Everlene Farrieransie, William, PA-C  doxycycline (VIBRAMYCIN) 100 MG capsule Take 1 capsule (100 mg total) by mouth 2 (two) times daily. 10/25/17   Caccavale, Sophia, PA-C  ferrous sulfate 325 (65 FE) MG tablet Take 1 tablet (325 mg total) by mouth 2 (two) times daily with a meal. 04/23/15   Nigel BridgemanLatham, Vicki, CNM  fluticasone [facility](FLONASE) 50 MCG/ACT nasal spray Place 2 sprays into both nostrils daily. 09/06/17   Everlene Farrieransie, William, PA-C  naproxen (NAPROSYN) 500 MG tablet Take 1 tablet (500 mg total) by mouth 2 (two) times daily with a meal. 09/01/17   Everlene Farrieransie, William, PA-C  norethindrone (ORTHO MICRONOR) 0.35 MG tablet Take 1 tablet (0.35 mg total) by mouth daily. 05/18/15   Nigel BridgemanLatham, Vicki, CNM  Prenatal Vit-Fe Fumarate-FA (PRENATAL MULTIVITAMIN) TABS tablet Take 2 tablets by mouth daily at 12 noon.     [provider]  ranitidine (ZANTAC) 150 MG tablet Take 1 tablet (150 mg total) by mouth 2 (two) times daily. Patient taking differently: Take 150 mg by mouth 2 (two) times daily as needed for heartburn.  12/21/14   Nigel BridgemanLatham, Vicki, CNM    Family  History Family History  Problem Relation Age of Onset  . Alcohol abuse Father        inpt tx 2007  . Suicidality Father        2007 inpt tx    Social History Social History   Tobacco Use  . Smoking status: Current Every Day Smoker    Packs/day: 0.50    Types: Cigarettes  . Smokeless tobacco: Former Engineer, water Use Topics  . Alcohol use: Yes    Comment: socially  . Drug use: No     Allergies   Patient has no known allergies.   Review of Systems Review of Systems  Constitutional: Negative for chills and fever.  HENT: Positive for sore throat. Negative for congestion, ear pain, rhinorrhea and voice change.     Respiratory: Negative for cough and shortness of breath.   Cardiovascular: Negative for chest pain.  Gastrointestinal: Negative for nausea and vomiting.     Physical Exam Updated Vital Signs BP 115/77 (BP Location: Right Arm)   Pulse 73   Temp 98.4 F (36.9 C) (Oral)   Resp 16   LMP 11/28/2017   SpO2 96%   Physical Exam  Constitutional: She appears well-developed and well-nourished.  Non-toxic appearance. No distress.  HENT:  Head: Normocephalic and atraumatic.  Right Ear: Tympanic membrane is not perforated, not erythematous, not retracted and not bulging.  Left Ear: Tympanic membrane is not perforated, not erythematous, not retracted and not bulging.  Nose: Nose normal. Right sinus exhibits no maxillary sinus tenderness and no frontal sinus tenderness. Left sinus exhibits no frontal sinus tenderness.  Mouth/Throat: Uvula is midline. Posterior oropharyngeal erythema (very mild) present. No oropharyngeal exudate or posterior oropharyngeal edema.  Patient is tolerating her own secretions without difficulty.  No trismus.  No drooling.  No hot potato voice.  Submandibular compartment is soft.  Eyes: Conjunctivae are normal. Pupils are equal, round, and reactive to light. Right eye exhibits no discharge. Left eye exhibits no discharge.  Neck: Normal range of motion. Neck supple.  Cardiovascular: Normal rate and regular rhythm.  No murmur heard. Pulmonary/Chest: Effort normal and breath sounds normal. No respiratory distress. She has no wheezes. She has no rhonchi. She has no rales.  Lymphadenopathy:    She has no cervical adenopathy (mild bliateral anterior).  Neurological: She is alert.  Skin: Skin is warm and dry. No rash noted.  Psychiatric: She has a normal mood and affect. Her behavior is normal.  Nursing note and vitals reviewed.    ED Treatments / Results  Labs (all labs ordered are listed, but only abnormal results are displayed) Labs Reviewed - No data to  display  EKG  EKG Interpretation None       Radiology No results found.  Procedures Procedures (including critical care time)  Medications Ordered in ED Medications - No data to display   Initial Impression / Assessment and Plan / ED Course  I have reviewed the triage vital signs and the nursing notes.  Pertinent labs & imaging results that were available during my care of the patient were reviewed by me and considered in my medical decision making (see chart for details).    Patient presents with sore throat. Patient is nontoxic appearing in no apparent distress, vitals WNL. Rapid strep ordered and negative. No abx therapy at this time culture is pending. Presentation non concerning for PTA or RPA. No trismus, uvula deviation, or hot potato voice. Patient has full painless ROM of the neck.  Will treat with decadron in the ED with prescription for naproxen at discharge.  Given the patient's symptoms have been ongoing for several months, instructed ENT follow-up for further evaluation and management.   I discussed results, treatment plan, need for PCP/ENT follow-up, and return precautions with the patient. Provided opportunity for questions, patient confirmed understanding and is in agreement with plan.    Final Clinical Impressions(s) / ED Diagnoses   Final diagnoses:  Sore throat    ED Discharge Orders        Ordered    naproxen (NAPROSYN) 500 MG tablet  2 times daily     12/06/17 [address], [place], PA-C 12/06/17 1402    Jacalyn Lefevre, MD 12/06/17 1454

## 2017-12-08 LAB — CULTURE, GROUP A STREP (THRC)

## 2017-12-11 ENCOUNTER — Emergency Department (HOSPITAL_COMMUNITY)
Admission: EM | Admit: 2017-12-11 | Discharge: 2017-12-11 | Disposition: A | Discharge status: Home / Self Care | Payer: Medicaid Other | Attending: Emergency Medicine | Admitting: Emergency Medicine

## 2017-12-11 ENCOUNTER — Encounter (HOSPITAL_COMMUNITY): Payer: Self-pay

## 2017-12-11 DIAGNOSIS — Z79899 Other long term (current) drug therapy: Secondary | ICD-10-CM | POA: Insufficient documentation

## 2017-12-11 DIAGNOSIS — F17210 Nicotine dependence, cigarettes, uncomplicated: Secondary | ICD-10-CM | POA: Insufficient documentation

## 2017-12-11 DIAGNOSIS — R69 Illness, unspecified: Secondary | ICD-10-CM

## 2017-12-11 DIAGNOSIS — J111 Influenza due to unidentified influenza virus with other respiratory manifestations: Secondary | ICD-10-CM | POA: Insufficient documentation

## 2017-12-11 MED ORDER — IBUPROFEN 800 MG PO TABS
800.0000 mg | ORAL_TABLET | Freq: Once | ORAL | Status: AC
  Administered 2017-12-11: 800 mg via ORAL
  Filled 2017-12-11: qty 1

## 2017-12-11 MED ORDER — BALOXAVIR MARBOXIL(80 MG DOSE) 2 X 40 MG PO TBPK: 2.0000 | ORAL_TABLET | Freq: Once | ORAL | 0 refills | Status: AC

## 2017-12-11 NOTE — ED Notes (Signed)
Declined W/C at D/C and was escorted to lobby by RN. 

## 2017-12-11 NOTE — ED Triage Notes (Signed)
PT states "I think I have the flu". Pt has been experiencing cough, sore throat, body aches on and off since christmas. Pt had multiple negative strep test in march

## 2017-12-11 NOTE — ED Provider Notes (Signed)
[facility] EMERGENCY DEPARTMENT Provider Note   CSN: [id number] Arrival date & time: 12/11/17  1141     History   Chief Complaint No chief complaint on file.   HPI Judith Osborn is a 22 y.o. female.  Who presents the emergency department chief complaint of flulike symptoms.  Patient states that she was diagnosed with influenza 2 weeks ago.  She was greatly improved.  She was exposed to influenza by her mother 2 days ago and now has body aches, chills, subjective fevers, cough, sore throat, runny nose and other influenza-like symptoms.  She has a 39-year-old at home and is worried about spread of virus.  She denies urinary symptoms abdominal symptoms, nausea, vomiting.  She has been able to hold down fluids.  HPI  Past Medical History:  Diagnosis Date  . Anxiety   . Depression     Patient Active Problem List   Diagnosis Date Noted  . Anemia 04/23/2015  . Vaginal delivery 04/22/2015  . Gestational hypertension 04/21/2015  . Cramping affecting pregnancy, antepartum 03/24/2015  . History of depression 03/24/2015  . History of anxiety 03/24/2015  . Rh negative state in antepartum period 08/23/2014    Past Surgical History:  Procedure Laterality Date  . NO PAST SURGERIES      OB History    Gravida Para Term Preterm AB Living   1 1 1     1    SAB TAB Ectopic Multiple Live Births         0 1       Home Medications    Prior to Admission medications   Medication Sig Start Date End Date Taking? Authorizing Provider  benzonatate (TESSALON) 100 MG capsule Take 1 capsule (100 mg total) by mouth every 8 (eight) hours. 12/02/17  Yes Cheron Schaumann K, PA-C  escitalopram (LEXAPRO) 5 MG tablet Take 5 mg by mouth daily.   Yes [provider]  acetaminophen (TYLENOL) 500 MG tablet Take 1,000 mg by mouth every 6 (six) hours as needed for mild pain or headache.     [provider]  Baloxavir Marboxil 80 MG Dose (XOFLUZA) 40 (2) MG TBPK Take 2  tablets by mouth once for 1 dose. 12/11/17 12/11/17  Arthor Captain, PA-C  cetirizine (ZYRTEC ALLERGY) 10 MG tablet Take 1 tablet (10 mg total) by mouth daily. 09/06/17   Everlene Farrier, PA-C  doxycycline (VIBRAMYCIN) 100 MG capsule Take 1 capsule (100 mg total) by mouth 2 (two) times daily. 10/25/17   Caccavale, Sophia, PA-C  ferrous sulfate 325 (65 FE) MG tablet Take 1 tablet (325 mg total) by mouth 2 (two) times daily with a meal. 04/23/15   Nigel Bridgeman, CNM  fluticasone [facility]) 50 MCG/ACT nasal spray Place 2 sprays into both nostrils daily. 09/06/17   Everlene Farrier, PA-C  naproxen (NAPROSYN) 500 MG tablet Take 1 tablet (500 mg total) by mouth 2 (two) times daily. 12/06/17   Petrucelli, Samantha R, PA-C  norethindrone (ORTHO MICRONOR) 0.35 MG tablet Take 1 tablet (0.35 mg total) by mouth daily. 05/18/15   Nigel Bridgeman, CNM  Prenatal Vit-Fe Fumarate-FA (PRENATAL MULTIVITAMIN) TABS tablet Take 2 tablets by mouth daily at 12 noon.     [provider]  ranitidine (ZANTAC) 150 MG tablet Take 1 tablet (150 mg total) by mouth 2 (two) times daily. Patient taking differently: Take 150 mg by mouth 2 (two) times daily as needed for heartburn.  12/21/14   Nigel Bridgeman, CNM    Family History  Family History  Problem Relation Age of Onset  . Alcohol abuse Father        inpt tx 2007  . Suicidality Father        2007 inpt tx    Social History Social History   Tobacco Use  . Smoking status: Current Every Day Smoker    Packs/day: 0.50    Types: Cigarettes  . Smokeless tobacco: Former Engineer, waterUser  Substance Use Topics  . Alcohol use: Yes    Comment: socially  . Drug use: No     Allergies   Patient has no known allergies.   Review of Systems Review of Systems  Ten systems reviewed and are negative for acute change, except as noted in the HPI.   Physical Exam Updated Vital Signs BP 125/75 (BP Location: Right Arm)   Pulse (!) 104   Temp 99.9 F (37.7 C) (Oral)   Resp 16   LMP  11/28/2017   SpO2 98%   Physical Exam Appears moderately ill but not toxic; temperature as noted in vitals. Ears normal. Eyes:glassy appearance, no discharge  Heart: RRR, NO M/G/R Throat and pharynx normal.   Neck supple. No adenopathyhy in the neck.  Sinuses non tender.  The chest is clear. Abdomen is soft and nontender Skin without rashes Normal behavior  ED Treatments / Results  Labs (all labs ordered are listed, but only abnormal results are displayed) Labs Reviewed - No data to display  EKG  EKG Interpretation None       Radiology No results found.  Procedures Procedures (including critical care time)  Medications Ordered in ED Medications - No data to display   Initial Impression / Assessment and Plan / ED Course  I have reviewed the triage vital signs and the nursing notes.  Pertinent labs & imaging results that were available during my care of the patient were reviewed by me and considered in my medical decision making (see chart for details).     Patient with symptoms consistent with influenza.  Vitals are stable, low-grade fever.  No signs of dehydration, tolerating PO's.  Lungs are clear. Due to patient's presentation and physical exam a chest x-ray was not ordered bc likely diagnosis of flu.  Pt discharged with Xofluza. Patient will be discharged with instructions to orally hydrate, rest, and use over-the-counter medications such as anti-inflammatories ibuprofen and Aleve for muscle aches and Tylenol for fever.  Patient will also be given a cough suppressant.    Final Clinical Impressions(s) / ED Diagnoses   Final diagnoses:  Influenza-like illness    ED Discharge Orders        Ordered    Baloxavir Marboxil 80 MG Dose (XOFLUZA) 40 (2) MG TBPK   Once     12/11/17 1542       Arthor CaptainHarris, Hila Bolding, PA-C 12/11/17 1550    Raeford RazorKohut, Stephen, MD 12/11/17 1717

## 2017-12-11 NOTE — Discharge Instructions (Signed)

## 2019-06-13 ENCOUNTER — Encounter (HOSPITAL_COMMUNITY): Payer: Self-pay | Admitting: Emergency Medicine

## 2019-06-13 ENCOUNTER — Emergency Department (HOSPITAL_COMMUNITY)
Admission: EM | Admit: 2019-06-13 | Discharge: 2019-06-13 | Disposition: A | Discharge status: Home / Self Care | Payer: Self-pay | Attending: Emergency Medicine | Admitting: Emergency Medicine

## 2019-06-13 ENCOUNTER — Emergency Department (HOSPITAL_COMMUNITY): Payer: Self-pay

## 2019-06-13 DIAGNOSIS — F17210 Nicotine dependence, cigarettes, uncomplicated: Secondary | ICD-10-CM | POA: Insufficient documentation

## 2019-06-13 DIAGNOSIS — Z20828 Contact with and (suspected) exposure to other viral communicable diseases: Secondary | ICD-10-CM | POA: Insufficient documentation

## 2019-06-13 DIAGNOSIS — R05 Cough: Secondary | ICD-10-CM

## 2019-06-13 DIAGNOSIS — J069 Acute upper respiratory infection, unspecified: Secondary | ICD-10-CM | POA: Insufficient documentation

## 2019-06-13 DIAGNOSIS — R059 Cough, unspecified: Secondary | ICD-10-CM

## 2019-06-13 DIAGNOSIS — Z79899 Other long term (current) drug therapy: Secondary | ICD-10-CM | POA: Insufficient documentation

## 2019-06-13 MED ORDER — DEXAMETHASONE 4 MG PO TABS
8.0000 mg | ORAL_TABLET | Freq: Once | ORAL | Status: AC
  Administered 2019-06-13: 17:00:00 8 mg via ORAL
  Filled 2019-06-13: qty 2

## 2019-06-13 MED ORDER — BENZONATATE 100 MG PO CAPS
100.0000 mg | ORAL_CAPSULE | Freq: Once | ORAL | Status: AC
  Administered 2019-06-13: 100 mg via ORAL
  Filled 2019-06-13: qty 1

## 2019-06-13 MED ORDER — BENZONATATE 100 MG PO CAPS: 100.0000 mg | ORAL_CAPSULE | Freq: Three times a day (TID) | ORAL | 0 refills | Status: DC | PRN

## 2019-06-13 NOTE — ED Provider Notes (Signed)
[facility] EMERGENCY DEPARTMENT Provider Note   CSN: [id number] Arrival date & time: 06/13/19  1141     History   Chief Complaint Chief Complaint  Patient presents with   Cough   Fever    HPI Judith Osborn is a 23 y.o. female.     The history is provided by the patient and medical records. No language interpreter was used.  Cough Associated symptoms: chills, fever and myalgias   Fever Associated symptoms: chills, cough and myalgias   Associated symptoms: no diarrhea, no dysuria, no nausea and no vomiting    Judith Osborn is a 23 y.o. female  with a PMH as listed below who presents to the Emergency Department complaining of cough, congestion for the last 3 days.  Associated with low-grade temperatures at home.  Temperatures in the 99, never higher than 100.  Daughter sick with similar symptoms and was tested for cocaine yesterday, but has no results back yet.  She has not been tested yet.  No urinary symptoms.  Has intermittent chest tightness, mostly after coughing.  No chest pain currently.  No shortness of breath.  Past Medical History:  Diagnosis Date   Anxiety    Depression     Patient Active Problem List   Diagnosis Date Noted   Anemia 04/23/2015   Vaginal delivery 04/22/2015   Gestational hypertension 04/21/2015   Cramping affecting pregnancy, antepartum 03/24/2015   History of depression 03/24/2015   History of anxiety 03/24/2015   Rh negative state in antepartum period 08/23/2014    Past Surgical History:  Procedure Laterality Date   NO PAST SURGERIES       OB History    Gravida  1   Para  1   Term  1   Preterm      AB      Living  1     SAB      TAB      Ectopic      Multiple  0   Live Births  1            Home Medications    Prior to Admission medications   Medication Sig Start Date End Date Taking? Authorizing Provider  acetaminophen (TYLENOL) 500 MG tablet Take 1,000 mg by mouth  every 6 (six) hours as needed for mild pain or headache.     [provider]  benzonatate (TESSALON) 100 MG capsule Take 1 capsule (100 mg total) by mouth 3 (three) times daily as needed for cough. 06/13/19   Dmauri Rosenow, Chase Picket, PA-C  cetirizine (ZYRTEC ALLERGY) 10 MG tablet Take 1 tablet (10 mg total) by mouth daily. 09/06/17   Everlene Farrier, PA-C  doxycycline (VIBRAMYCIN) 100 MG capsule Take 1 capsule (100 mg total) by mouth 2 (two) times daily. 10/25/17   Caccavale, Sophia, PA-C  escitalopram (LEXAPRO) 5 MG tablet Take 5 mg by mouth daily.    [provider]  ferrous sulfate 325 (65 FE) MG tablet Take 1 tablet (325 mg total) by mouth 2 (two) times daily with a meal. 04/23/15   Nigel Bridgeman, CNM  fluticasone [facility]) 50 MCG/ACT nasal spray Place 2 sprays into both nostrils daily. 09/06/17   Everlene Farrier, PA-C  naproxen (NAPROSYN) 500 MG tablet Take 1 tablet (500 mg total) by mouth 2 (two) times daily. 12/06/17   Petrucelli, Samantha R, PA-C  norethindrone (ORTHO MICRONOR) 0.35 MG tablet Take 1 tablet (0.35 mg total) by mouth daily. 05/18/15  Donnel Saxon, CNM  Prenatal Vit-Fe Fumarate-FA (PRENATAL MULTIVITAMIN) TABS tablet Take 2 tablets by mouth daily at 12 noon.     [provider]  ranitidine (ZANTAC) 150 MG tablet Take 1 tablet (150 mg total) by mouth 2 (two) times daily. Patient taking differently: Take 150 mg by mouth 2 (two) times daily as needed for heartburn.  12/21/14   Donnel Saxon, CNM    Family History Family History  Problem Relation Age of Onset   Alcohol abuse Father        inpt tx 2007   Suicidality Father        2007 inpt tx    Social History Social History   Tobacco Use   Smoking status: Current Every Day Smoker    Packs/day: 0.50    Types: Cigarettes   Smokeless tobacco: Former Systems developer  Substance Use Topics   Alcohol use: Yes    Comment: socially   Drug use: No     Allergies   Patient has no known allergies.   Review  of Systems Review of Systems  Constitutional: Positive for chills and fever.  Respiratory: Positive for cough.   Gastrointestinal: Negative for abdominal pain, constipation, diarrhea, nausea and vomiting.  Genitourinary: Negative for difficulty urinating and dysuria.  Musculoskeletal: Positive for myalgias.  All other systems reviewed and are negative.    Physical Exam Updated Vital Signs BP 117/80 (BP Location: Right Arm)    Pulse 96    Temp 99 F (37.2 C) (Oral)    Resp 18    SpO2 97%   Physical Exam Vitals signs and nursing note reviewed.  Constitutional:      General: She is not in acute distress.    Appearance: She is well-developed.     Comments: Non-toxic appearing.  HENT:     Head: Normocephalic and atraumatic.     Mouth/Throat:     Comments: OP with erythema, but no tonsillar hypertrophy or exudates.  Neck:     Musculoskeletal: Neck supple.     Comments: No meningeal signs. Cardiovascular:     Rate and Rhythm: Normal rate and regular rhythm.     Heart sounds: Normal heart sounds. No murmur.  Pulmonary:     Effort: Pulmonary effort is normal. No respiratory distress.     Breath sounds: Normal breath sounds.     Comments: Lungs clear to auscultation bilaterally. Abdominal:     General: There is no distension.     Palpations: Abdomen is soft.     Tenderness: There is no abdominal tenderness.  Skin:    General: Skin is warm and dry.  Neurological:     Mental Status: She is alert and oriented to person, place, and time.      ED Treatments / Results  Labs (all labs ordered are listed, but only abnormal results are displayed) Labs Reviewed  SARS CORONAVIRUS 2 (TAT 6-24 HRS)    EKG None  Radiology Dg Chest Portable 1 View  Result Date: 06/13/2019 CLINICAL DATA:  Pt states for 3 days she has had fever, chills and cough. Pt states her daughter is also sick and was tested for covid yesterday with results pending. Cough EXAM: PORTABLE CHEST 1 VIEW COMPARISON:   12/02/2017 FINDINGS: The heart size and mediastinal contours are within normal limits. Both lungs are clear. The visualized skeletal structures are unremarkable. IMPRESSION: No active disease. Electronically Signed   By: Nolon Nations M.D.   On: 06/13/2019 15:56    Procedures Procedures (including critical  care time)  Medications Ordered in ED Medications  dexamethasone (DECADRON) tablet 8 mg (has no administration in time range)  benzonatate (TESSALON) capsule 100 mg (has no administration in time range)     Initial Impression / Assessment and Plan / ED Course  I have reviewed the triage vital signs and the nursing notes.  Pertinent labs & imaging results that were available during my care of the patient were reviewed by me and considered in my medical decision making (see chart for details).       Smitty PluckMaryssa R Carol is a 23 y.o. female who presents to ED for cough, congestion, generalized body aches and low grade temperature for the last 3 days. Daughter with similar sxs.  Temp of 99 in triage. On exam, she is non-toxic appearing with a clear lung exam. Mild rhinorrhea and OP with erythema but no exudates or tonsillar hypertrophy.  CXR negative.  Covid obtained and pending.   Sxs today likely due to viral illness. Symptomatic home care instructions discussed.  Rx for tessalon given. PCP follow up strongly encouraged if symptoms persist. Reasons to return to ER discussed. All questions answered.    Final Clinical Impressions(s) / ED Diagnoses   Final diagnoses:  Cough  Upper respiratory tract infection, unspecified type    ED Discharge Orders         Ordered    benzonatate (TESSALON) 100 MG capsule  3 times daily PRN     06/13/19 1616           Shandell Giovanni, Chase PicketJaime Pilcher, PA-C 06/13/19 1639    Tilden Fossaees, Elizabeth, MD 06/13/19 2056

## 2019-06-13 NOTE — Discharge Instructions (Addendum)
It was my pleasure taking care of you today!  Tessalon as needed for cough.  You were tested for Coronavirus today. You will be notified if results are positive.   Return to ER for new or worsening symptoms, any additional concerns.

## 2019-06-13 NOTE — ED Triage Notes (Signed)
Pt states for 3 days she has had fever, chills and cough. Pt states her daughter is also sick and was tested for covid yesterday with results pending.

## 2019-06-14 LAB — SARS CORONAVIRUS 2 (TAT 6-24 HRS): SARS Coronavirus 2: NEGATIVE

## 2019-10-31 ENCOUNTER — Encounter (HOSPITAL_COMMUNITY): Payer: Self-pay | Admitting: Family Medicine

## 2019-10-31 ENCOUNTER — Other Ambulatory Visit: Payer: Self-pay

## 2019-10-31 ENCOUNTER — Emergency Department (HOSPITAL_COMMUNITY)
Admission: EM | Admit: 2019-10-31 | Discharge: 2019-10-31 | Disposition: A | Discharge status: Home / Self Care | Payer: HRSA Program | Attending: Emergency Medicine | Admitting: Emergency Medicine

## 2019-10-31 DIAGNOSIS — R112 Nausea with vomiting, unspecified: Secondary | ICD-10-CM | POA: Insufficient documentation

## 2019-10-31 DIAGNOSIS — F17210 Nicotine dependence, cigarettes, uncomplicated: Secondary | ICD-10-CM | POA: Diagnosis not present

## 2019-10-31 DIAGNOSIS — Z20822 Contact with and (suspected) exposure to covid-19: Secondary | ICD-10-CM | POA: Insufficient documentation

## 2019-10-31 LAB — URINALYSIS, ROUTINE W REFLEX MICROSCOPIC
Bilirubin Urine: NEGATIVE
Glucose, UA: NEGATIVE mg/dL
Ketones, ur: 80 mg/dL — AB
Leukocytes,Ua: NEGATIVE
Nitrite: NEGATIVE
Protein, ur: 100 mg/dL — AB
Specific Gravity, Urine: 1.026 (ref 1.005–1.030)
pH: 6 (ref 5.0–8.0)

## 2019-10-31 LAB — I-STAT BETA HCG BLOOD, ED (MC, WL, AP ONLY): I-stat hCG, quantitative: <5 m[IU]/mL (ref <5)

## 2019-10-31 LAB — COMPREHENSIVE METABOLIC PANEL
ALT: 15 U/L (ref 0–44)
AST: 24 U/L (ref 15–41)
Albumin: 5.6 g/dL — ABNORMAL HIGH (ref 3.5–5.0)
Alkaline Phosphatase: 53 U/L (ref 38–126)
Anion gap: 12 (ref 5–15)
BUN: 10 mg/dL (ref 6–20)
CO2: 24 mmol/L (ref 22–32)
Calcium: 9.9 mg/dL (ref 8.9–10.3)
Chloride: 105 mmol/L (ref 98–111)
Creatinine, Ser: 0.65 mg/dL (ref 0.44–1.00)
GFR calc Af Amer: >60 mL/min (ref >60)
GFR calc non Af Amer: >60 mL/min (ref >60)
Glucose, Bld: 101 mg/dL — ABNORMAL HIGH (ref 70–99)
Potassium: 3.8 mmol/L (ref 3.5–5.1)
Sodium: 141 mmol/L (ref 135–145)
Total Bilirubin: 0.9 mg/dL (ref 0.3–1.2)
Total Protein: 9.1 g/dL — ABNORMAL HIGH (ref 6.5–8.1)

## 2019-10-31 LAB — LIPASE, BLOOD: Lipase: 25 U/L (ref 11–51)

## 2019-10-31 LAB — CBC
HCT: 45.6 % (ref 36.0–46.0)
Hemoglobin: 15.6 g/dL — ABNORMAL HIGH (ref 12.0–15.0)
MCH: 32.9 pg (ref 26.0–34.0)
MCHC: 34.2 g/dL (ref 30.0–36.0)
MCV: 96.2 fL (ref 80.0–100.0)
Platelets: 185 10*3/uL (ref 150–400)
RBC: 4.74 MIL/uL (ref 3.87–5.11)
RDW: 12 % (ref 11.5–15.5)
WBC: 11.5 10*3/uL — ABNORMAL HIGH (ref 4.0–10.5)
nRBC: 0 % (ref 0.0–0.2)

## 2019-10-31 MED ORDER — ONDANSETRON HCL 4 MG PO TABS: 4.0000 mg | ORAL_TABLET | Freq: Three times a day (TID) | ORAL | 0 refills | Status: AC | PRN

## 2019-10-31 MED ORDER — SODIUM CHLORIDE 0.9% FLUSH: 3.0000 mL | Freq: Once | INTRAVENOUS | Status: DC

## 2019-10-31 MED ORDER — SODIUM CHLORIDE 0.9 % IV BOLUS
1000.0000 mL | Freq: Once | INTRAVENOUS | Status: AC
  Administered 2019-10-31: 18:00:00 1000 mL via INTRAVENOUS

## 2019-10-31 MED ORDER — ONDANSETRON 4 MG PO TBDP
4.0000 mg | ORAL_TABLET | Freq: Once | ORAL | Status: AC | PRN
  Administered 2019-10-31: 4 mg via ORAL
  Filled 2019-10-31 (×2): qty 1

## 2019-10-31 NOTE — ED Provider Notes (Signed)
[facility]-EMERGENCY DEPT Provider Note   CSN: [id number] Arrival date & time: 10/31/19  1513     History Chief Complaint  Patient presents with  . Emesis    Judith Osborn is a 24 y.o. female.  She has no significant past medical history.  She said she acutely became nauseous and vomited multiple times at work.  She went home and rested for a little while but woke up with nausea and vomiting again.  Feels feverish but never checked.  Does have a headache.  No cough chest pain abdominal pain urinary symptoms.  Last menstrual period was the beginning of January.  States she has irregular periods.  No abdominal surgical history.  Does have a sick contact with Covid.  The history is provided by the patient.  Emesis Severity:  Severe Timing:  Intermittent Number of daily episodes:  10 Quality:  Stomach contents Progression:  Improving Chronicity:  New Recent urination:  Normal Relieved by:  None tried Worsened by:  Nothing Ineffective treatments:  None tried Associated symptoms: fever (??) and headaches   Associated symptoms: no abdominal pain, no arthralgias, no cough, no diarrhea, no myalgias, no sore throat and no URI   Risk factors: sick contacts   Risk factors: no diabetes and no prior abdominal surgery        Past Medical History:  Diagnosis Date  . Anxiety   . Depression     Patient Active Problem List   Diagnosis Date Noted  . Anemia 04/23/2015  . Vaginal delivery 04/22/2015  . Gestational hypertension 04/21/2015  . Cramping affecting pregnancy, antepartum 03/24/2015  . History of depression 03/24/2015  . History of anxiety 03/24/2015  . Rh negative state in antepartum period 08/23/2014    Past Surgical History:  Procedure Laterality Date  . NO PAST SURGERIES       OB History    Gravida  1   Para  1   Term  1   Preterm      AB      Living  1     SAB      TAB      Ectopic      Multiple  0   Live Births  1             Family History  Problem Relation Age of Onset  . Alcohol abuse Father        inpt tx 2007  . Suicidality Father        2007 inpt tx    Social History   Tobacco Use  . Smoking status: Current Every Day Smoker    Packs/day: 0.50    Types: Cigarettes  . Smokeless tobacco: Former Engineer, water Use Topics  . Alcohol use: Not Currently  . Drug use: No    Home Medications Prior to Admission medications   Medication Sig Start Date End Date Taking? Authorizing Provider  acetaminophen (TYLENOL) 500 MG tablet Take 1,000 mg by mouth every 6 (six) hours as needed for mild pain or headache.     [provider]  benzonatate (TESSALON) 100 MG capsule Take 1 capsule (100 mg total) by mouth 3 (three) times daily as needed for cough. 06/13/19   Ward, Chase Picket, PA-C  cetirizine (ZYRTEC ALLERGY) 10 MG tablet Take 1 tablet (10 mg total) by mouth daily. 09/06/17   Everlene Farrier, PA-C  doxycycline (VIBRAMYCIN) 100 MG capsule Take 1 capsule (100 mg total) by mouth 2 (two) times  daily. 10/25/17   Caccavale, Sophia, PA-C  escitalopram (LEXAPRO) 5 MG tablet Take 5 mg by mouth daily.    [provider]  ferrous sulfate 325 (65 FE) MG tablet Take 1 tablet (325 mg total) by mouth 2 (two) times daily with a meal. 04/23/15   Nigel Bridgeman, CNM  fluticasone [facility]) 50 MCG/ACT nasal spray Place 2 sprays into both nostrils daily. 09/06/17   Everlene Farrier, PA-C  naproxen (NAPROSYN) 500 MG tablet Take 1 tablet (500 mg total) by mouth 2 (two) times daily. 12/06/17   Petrucelli, Samantha R, PA-C  norethindrone (ORTHO MICRONOR) 0.35 MG tablet Take 1 tablet (0.35 mg total) by mouth daily. 05/18/15   Nigel Bridgeman, CNM  Prenatal Vit-Fe Fumarate-FA (PRENATAL MULTIVITAMIN) TABS tablet Take 2 tablets by mouth daily at 12 noon.     [provider]  ranitidine (ZANTAC) 150 MG tablet Take 1 tablet (150 mg total) by mouth 2 (two) times daily. Patient taking differently: Take 150 mg by  mouth 2 (two) times daily as needed for heartburn.  12/21/14   Nigel Bridgeman, CNM    Allergies    Patient has no known allergies.  Review of Systems   Review of Systems  Constitutional: Positive for fever (??).  HENT: Negative for sore throat.   Eyes: Negative for visual disturbance.  Respiratory: Negative for cough and shortness of breath.   Cardiovascular: Negative for chest pain.  Gastrointestinal: Positive for nausea and vomiting. Negative for abdominal pain and diarrhea.  Genitourinary: Negative for dysuria.  Musculoskeletal: Negative for arthralgias and myalgias.  Skin: Negative for rash.  Neurological: Positive for headaches.    Physical Exam Updated Vital Signs BP 120/90 (BP Location: Right Arm)   Pulse 80   Resp 20   Ht 5\' 5"  (1.651 m)   Wt 81.6 kg   LMP 09/28/2019   SpO2 99%   BMI 29.95 kg/m   Physical Exam Vitals and nursing note reviewed.  Constitutional:      General: She is not in acute distress.    Appearance: She is well-developed.  HENT:     Head: Normocephalic and atraumatic.  Eyes:     Conjunctiva/sclera: Conjunctivae normal.  Cardiovascular:     Rate and Rhythm: Normal rate and regular rhythm.     Heart sounds: No murmur.  Pulmonary:     Effort: Pulmonary effort is normal. No respiratory distress.     Breath sounds: Normal breath sounds.  Abdominal:     Palpations: Abdomen is soft.     Tenderness: There is no abdominal tenderness.  Musculoskeletal:        General: No deformity or signs of injury. Normal range of motion.     Cervical back: Neck supple.  Skin:    General: Skin is warm and dry.     Capillary Refill: Capillary refill takes less than 2 seconds.  Neurological:     General: No focal deficit present.     Mental Status: She is alert.     ED Results / Procedures / Treatments   Labs (all labs ordered are listed, but only abnormal results are displayed) Labs Reviewed  COMPREHENSIVE METABOLIC PANEL - Abnormal; Notable for the  following components:      Result Value   Glucose, Bld 101 (*)    Total Protein 9.1 (*)    Albumin 5.6 (*)    All other components within normal limits  CBC - Abnormal; Notable for the following components:   WBC 11.5 (*)  Hemoglobin 15.6 (*)    All other components within normal limits  URINALYSIS, ROUTINE W REFLEX MICROSCOPIC - Abnormal; Notable for the following components:   Hgb urine dipstick SMALL (*)    Ketones, ur 80 (*)    Protein, ur 100 (*)    Bacteria, UA RARE (*)    All other components within normal limits  NOVEL CORONAVIRUS, NAA (HOSP ORDER, SEND-OUT TO REF LAB; TAT 18-24 HRS)  LIPASE, BLOOD  I-STAT BETA HCG BLOOD, ED ([facility], [facility], [facility] ONLY)    EKG None  Radiology No results found.  Procedures Procedures (including critical care time)  Medications Ordered in ED Medications  sodium chloride flush (NS) 0.9 % injection 3 mL (0 mLs Intravenous Hold 10/31/19 1755)  ondansetron (ZOFRAN-ODT) disintegrating tablet 4 mg (4 mg Oral Given 10/31/19 1532)  sodium chloride 0.9 % bolus 1,000 mL (0 mLs Intravenous Stopped 10/31/19 1946)    ED Course  I have reviewed the triage vital signs and the nursing notes.  Pertinent labs & imaging results that were available during my care of the patient were reviewed by me and considered in my medical decision making (see chart for details).  Clinical Course as of Oct 31 14  Wed Oct 31, 2019  1750 Differential includes obstruction, gastroenteritis, Covid, food poisoning, early pregnancy.   [MB]  3149 Labs showing mild elevation in her white count.  Urine showing 80 ketones getting IV fluids.  Pregnancy test negative.   [MB]  1903 Patient has received some IV fluids here and is tolerating some oral fluids and crackers.  She is comfortable with discharge.  She would like a Covid test   [MB]    Clinical Course User Index [MB] Hayden Rasmussen, MD   MDM Rules/Calculators/A&P                     MONTGOMERY ROTHLISBERGER was evaluated in  Emergency Department on 10/31/2019 for the symptoms described in the history of present illness. She was evaluated in the context of the global COVID-19 pandemic, which necessitated consideration that the patient might be at risk for infection with the SARS-CoV-2 virus that causes COVID-19. Institutional protocols and algorithms that pertain to the evaluation of patients at risk for COVID-19 are in a state of rapid change based on information released by regulatory bodies including the CDC and federal and state organizations. These policies and algorithms were followed during the patient's care in the ED.   Final Clinical Impression(s) / ED Diagnoses Final diagnoses:  Non-intractable vomiting with nausea, unspecified vomiting type  Person under investigation for COVID-19    Rx / DC Orders ED Discharge Orders         Ordered    ondansetron (ZOFRAN) 4 MG tablet  Every 8 hours PRN     10/31/19 1908           Hayden Rasmussen, MD 11/01/19 [phone]

## 2019-10-31 NOTE — Discharge Instructions (Signed)
You were seen in the emergency department for nausea and vomiting.  You had blood work and a urinalysis that did not show any serious findings.  We are sending you home with a prescription for nausea medication.  You were also tested for Covid and will need to isolate until your tests are resulted in the next 1 to 2 days.

## 2019-10-31 NOTE — ED Notes (Signed)
Crackers and PO fluids given to patient as ordered.

## 2019-10-31 NOTE — ED Triage Notes (Signed)
Patient states she was at work and all of a sudden she was nausea with vomiting. Denies any abd pain or fever.

## 2019-11-02 LAB — NOVEL CORONAVIRUS, NAA (HOSP ORDER, SEND-OUT TO REF LAB; TAT 18-24 HRS): SARS-CoV-2, NAA: NOT DETECTED

## 2019-11-07 ENCOUNTER — Ambulatory Visit: Payer: Medicaid Other | Attending: Internal Medicine

## 2019-11-07 DIAGNOSIS — Z20822 Contact with and (suspected) exposure to covid-19: Secondary | ICD-10-CM

## 2019-11-08 LAB — NOVEL CORONAVIRUS, NAA: SARS-CoV-2, NAA: DETECTED — AB

## 2020-06-22 ENCOUNTER — Other Ambulatory Visit: Payer: Self-pay

## 2020-06-22 ENCOUNTER — Emergency Department (HOSPITAL_COMMUNITY)
Admission: EM | Admit: 2020-06-22 | Discharge: 2020-06-23 | Disposition: A | Discharge status: Home / Self Care | Payer: Medicaid Other | Attending: Emergency Medicine | Admitting: Emergency Medicine

## 2020-06-22 ENCOUNTER — Emergency Department (HOSPITAL_COMMUNITY): Payer: Medicaid Other

## 2020-06-22 ENCOUNTER — Encounter (HOSPITAL_COMMUNITY): Payer: Self-pay | Admitting: Emergency Medicine

## 2020-06-22 DIAGNOSIS — R55 Syncope and collapse: Secondary | ICD-10-CM | POA: Insufficient documentation

## 2020-06-22 DIAGNOSIS — F17210 Nicotine dependence, cigarettes, uncomplicated: Secondary | ICD-10-CM | POA: Insufficient documentation

## 2020-06-22 MED ORDER — SODIUM CHLORIDE 0.9 % IV BOLUS
1000.0000 mL | Freq: Once | INTRAVENOUS | Status: AC
  Administered 2020-06-23: 1000 mL via INTRAVENOUS

## 2020-06-22 NOTE — ED Triage Notes (Signed)
Pt was out with family taking pictures and had a syncopal episode. Pt states right before it happened she began having tunnel vision then woke up on the ground.

## 2020-06-22 NOTE — ED Provider Notes (Signed)
[facility] EMERGENCY DEPARTMENT Provider Note   CSN: [id number] Arrival date & time: 06/22/20  1902     History Chief Complaint  Patient presents with  . Loss of Consciousness    Judith Osborn is a 24 y.o. female.  Patient presents to the emergency department for evaluation of syncope.  Patient reports that she passed out earlier this evening.  She reports that she was taking pictures with her family when this occurred.  She had not been standing for prolonged period of time, exerting herself or changing positions.  She just suddenly felt ill, then got tunnel vision and then passed out.  This has not happened previously.  She has not been ill recently, denies fever, URI/Covid symptoms.  She feels weak at the moment but has no other complaints.        Past Medical History:  Diagnosis Date  . Anxiety   . Depression     Patient Active Problem List   Diagnosis Date Noted  . Anemia 04/23/2015  . Vaginal delivery 04/22/2015  . Gestational hypertension 04/21/2015  . Cramping affecting pregnancy, antepartum 03/24/2015  . History of depression 03/24/2015  . History of anxiety 03/24/2015  . Rh negative state in antepartum period 08/23/2014    Past Surgical History:  Procedure Laterality Date  . NO PAST SURGERIES       OB History    Gravida  1   Para  1   Term  1   Preterm      AB      Living  1     SAB      TAB      Ectopic      Multiple  0   Live Births  1           Family History  Problem Relation Age of Onset  . Alcohol abuse Father        inpt tx 2007  . Suicidality Father        2007 inpt tx    Social History   Tobacco Use  . Smoking status: Current Every Day Smoker    Packs/day: 0.50    Types: Cigarettes  . Smokeless tobacco: Former Engineer, water Use Topics  . Alcohol use: Not Currently  . Drug use: No    Home Medications Prior to Admission medications   Medication Sig Start Date End Date Taking? Authorizing Provider    acetaminophen (TYLENOL) 500 MG tablet Take 1,000 mg by mouth every 6 (six) hours as needed for mild pain or headache.     [provider]  benzonatate (TESSALON) 100 MG capsule Take 1 capsule (100 mg total) by mouth 3 (three) times daily as needed for cough. Patient not taking: Reported on 10/31/2019 06/13/19   Ward, Chase Picket, PA-C  cetirizine (ZYRTEC ALLERGY) 10 MG tablet Take 1 tablet (10 mg total) by mouth daily. Patient not taking: Reported on 10/31/2019 09/06/17   Everlene Farrier, PA-C  doxycycline (VIBRAMYCIN) 100 MG capsule Take 1 capsule (100 mg total) by mouth 2 (two) times daily. Patient not taking: Reported on 10/31/2019 10/25/17   Caccavale, Sophia, PA-C  escitalopram (LEXAPRO) 5 MG tablet Take 5 mg by mouth daily.    [provider]  ferrous sulfate 325 (65 FE) MG tablet Take 1 tablet (325 mg total) by mouth 2 (two) times daily with a meal. Patient not taking: Reported on 10/31/2019 04/23/15   Nigel Bridgeman, CNM  fluticasone [facility]) 50 MCG/ACT nasal spray Place  2 sprays into both nostrils daily. Patient not taking: Reported on 10/31/2019 09/06/17   Everlene Farrier, PA-C  naproxen (NAPROSYN) 500 MG tablet Take 1 tablet (500 mg total) by mouth 2 (two) times daily. Patient not taking: Reported on 10/31/2019 12/06/17   Petrucelli, Pleas Koch, PA-C  norethindrone (ORTHO MICRONOR) 0.35 MG tablet Take 1 tablet (0.35 mg total) by mouth daily. Patient not taking: Reported on 10/31/2019 05/18/15   Nigel Bridgeman, CNM  ondansetron (ZOFRAN) 4 MG tablet Take 1 tablet (4 mg total) by mouth every 8 (eight) hours as needed for nausea or vomiting. 10/31/19   Terrilee Files, MD  ranitidine (ZANTAC) 150 MG tablet Take 1 tablet (150 mg total) by mouth 2 (two) times daily. Patient not taking: Reported on 10/31/2019 12/21/14   Nigel Bridgeman, CNM    Allergies    Patient has no known allergies.  Review of Systems   Review of Systems  Constitutional: Positive for fatigue.  Neurological: Positive  for syncope.  All other systems reviewed and are negative.   Physical Exam Updated Vital Signs BP 115/66 (BP Location: Right Arm)   Pulse 61   Temp 98 F (36.7 C) (Oral)   Resp 16   Ht 5\' 5"  (1.651 m)   Wt 67.1 kg   LMP 06/08/2020   SpO2 99%   BMI 24.63 kg/m   Physical Exam Vitals and nursing note reviewed.  Constitutional:      General: She is not in acute distress.    Appearance: Normal appearance. She is well-developed.  HENT:     Head: Normocephalic and atraumatic.     Right Ear: Hearing normal.     Left Ear: Hearing normal.     Nose: Nose normal.  Eyes:     Conjunctiva/sclera: Conjunctivae normal.     Pupils: Pupils are equal, round, and reactive to light.  Cardiovascular:     Rate and Rhythm: Regular rhythm.     Heart sounds: S1 normal and S2 normal. No murmur heard.  No friction rub. No gallop.   Pulmonary:     Effort: Pulmonary effort is normal. No respiratory distress.     Breath sounds: Normal breath sounds.  Chest:     Chest wall: No tenderness.  Abdominal:     General: Bowel sounds are normal.     Palpations: Abdomen is soft.     Tenderness: There is no abdominal tenderness. There is no guarding or rebound. Negative signs include Murphy's sign and McBurney's sign.     Hernia: No hernia is present.  Musculoskeletal:        General: Normal range of motion.     Cervical back: Normal range of motion and neck supple.  Skin:    General: Skin is warm and dry.     Findings: No rash.  Neurological:     Mental Status: She is alert and oriented to person, place, and time.     GCS: GCS eye subscore is 4. GCS verbal subscore is 5. GCS motor subscore is 6.     Cranial Nerves: No cranial nerve deficit.     Sensory: No sensory deficit.     Coordination: Coordination normal.  Psychiatric:        Speech: Speech normal.        Behavior: Behavior normal.        Thought Content: Thought content normal.     ED Results / Procedures / Treatments   Labs (all labs  ordered are listed, but only abnormal  results are displayed) Labs Reviewed  BASIC METABOLIC PANEL - Abnormal; Notable for the following components:      Result Value   Potassium 3.4 (*)    Calcium 8.6 (*)    All other components within normal limits  CBC - Abnormal; Notable for the following components:   RBC 3.74 (*)    All other components within normal limits  URINALYSIS, ROUTINE W REFLEX MICROSCOPIC  POC URINE PREG, ED    EKG None  Radiology CT HEAD WO CONTRAST  Result Date: 06/22/2020 CLINICAL DATA:  Headache.  Syncopal episode. EXAM: CT HEAD WITHOUT CONTRAST TECHNIQUE: Contiguous axial images were obtained from the base of the skull through the vertex without intravenous contrast. COMPARISON:  Brain CT February 28, 2012 FINDINGS: Brain: No evidence of acute infarction, hemorrhage, hydrocephalus, extra-axial collection or mass lesion/mass effect. Vascular: No hyperdense vessel or unexpected calcification. Skull: Normal. Negative for fracture or focal lesion. Sinuses/Orbits: Paranasal sinuses are well aerated. Mastoid air cells are unremarkable. Orbits are unremarkable. Other: None. IMPRESSION: No acute intracranial process. Electronically Signed   By: Annia Belt M.D.   On: 06/22/2020 20:06    Procedures Procedures (including critical care time)  Medications Ordered in ED Medications  sodium chloride 0.9 % bolus 1,000 mL (0 mLs Intravenous Stopped 06/23/20 0138)    ED Course  I have reviewed the triage vital signs and the nursing notes.  Pertinent labs & imaging results that were available during my care of the patient were reviewed by me and considered in my medical decision making (see chart for details).    MDM Rules/Calculators/A&P                          Patient presents to the emergency department for evaluation of a syncopal episode.  Patient had precursor symptoms of weakness, dizziness and then tunnel vision followed by passing out briefly.  No seizure-like activity.   She is back to normal neurologic baseline.  Lab work is unremarkable.  She has not been able to give [place] a urine, despite being in the emergency department for 7 hours and receiving IV fluids.  She did not have urinary symptoms, will discharge.  Final Clinical Impression(s) / ED Diagnoses Final diagnoses:  Vasovagal syncope    Rx / DC Orders ED Discharge Orders    None       Davielle Lingelbach, Canary Brim, MD 06/23/20 [phone]

## 2020-06-23 LAB — CBC
HCT: 37.3 % (ref 36.0–46.0)
Hemoglobin: 12.6 g/dL (ref 12.0–15.0)
MCH: 33.7 pg (ref 26.0–34.0)
MCHC: 33.8 g/dL (ref 30.0–36.0)
MCV: 99.7 fL (ref 80.0–100.0)
Platelets: 175 10*3/uL (ref 150–400)
RBC: 3.74 MIL/uL — ABNORMAL LOW (ref 3.87–5.11)
RDW: 12.8 % (ref 11.5–15.5)
WBC: 8.3 10*3/uL (ref 4.0–10.5)
nRBC: 0 % (ref 0.0–0.2)

## 2020-06-23 LAB — BASIC METABOLIC PANEL
Anion gap: 9 (ref 5–15)
BUN: 9 mg/dL (ref 6–20)
CO2: 27 mmol/L (ref 22–32)
Calcium: 8.6 mg/dL — ABNORMAL LOW (ref 8.9–10.3)
Chloride: 102 mmol/L (ref 98–111)
Creatinine, Ser: 0.61 mg/dL (ref 0.44–1.00)
GFR calc Af Amer: >60 mL/min (ref >60)
GFR calc non Af Amer: >60 mL/min (ref >60)
Glucose, Bld: 99 mg/dL (ref 70–99)
Potassium: 3.4 mmol/L — ABNORMAL LOW (ref 3.5–5.1)
Sodium: 138 mmol/L (ref 135–145)

## 2020-06-23 LAB — URINALYSIS, ROUTINE W REFLEX MICROSCOPIC
Bilirubin Urine: NEGATIVE
Glucose, UA: NEGATIVE mg/dL
Hgb urine dipstick: NEGATIVE
Ketones, ur: NEGATIVE mg/dL
Leukocytes,Ua: NEGATIVE
Nitrite: NEGATIVE
Protein, ur: NEGATIVE mg/dL
Specific Gravity, Urine: 1.006 (ref 1.005–1.030)
pH: 6 (ref 5.0–8.0)

## 2020-06-23 LAB — PREGNANCY, URINE: Preg Test, Ur: NEGATIVE

## 2020-06-23 NOTE — ED Notes (Signed)
Pt. States they are unable to provide a urine sample at this time.

## 2021-01-20 IMAGING — CT CT HEAD W/O CM
4 series · 17 of 47 positions shown, 19 images · non-contrast
Comparison: Brain CT February 28, 2012

CLINICAL DATA: Headache.  Syncopal episode.

EXAM:
CT HEAD WITHOUT CONTRAST
TECHNIQUE: Contiguous axial images were obtained from the base of the skull
through the vertex without intravenous contrast.

[Series 2: head w o · axial · 0.47mm/px · z∈[+236,+341]mm · 7 of 29 slices shown, 9 images]
[im 4/29  brain]
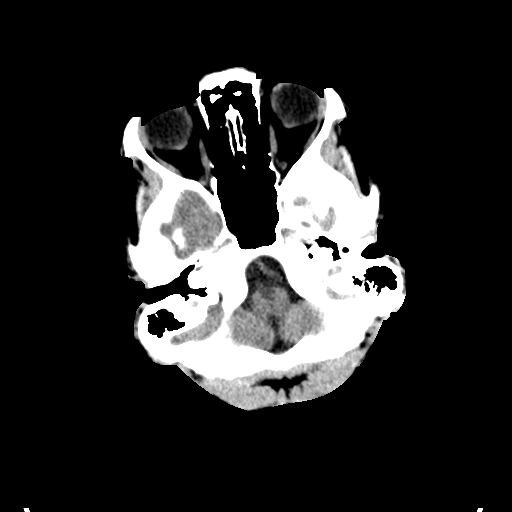
[im 4/29  bone]
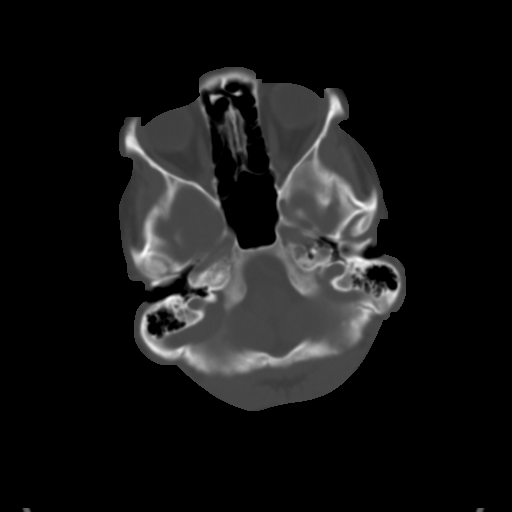
[im 8/29  brain]
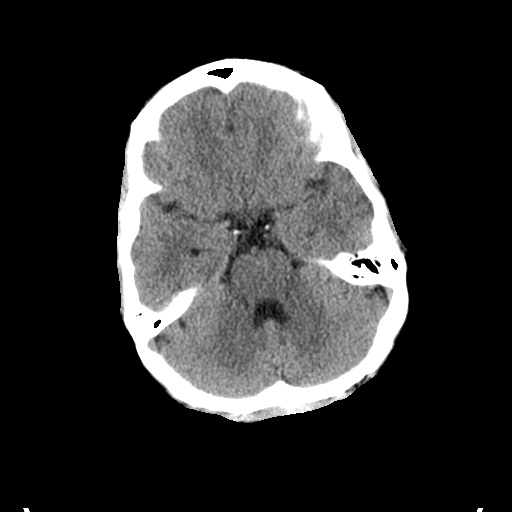
[im 11/29  brain]
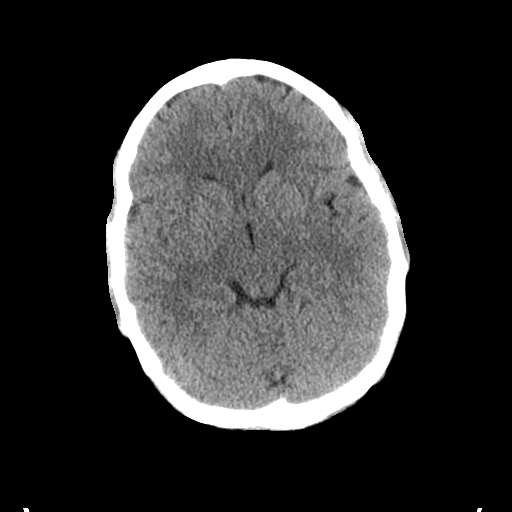
[im 15/29  brain]
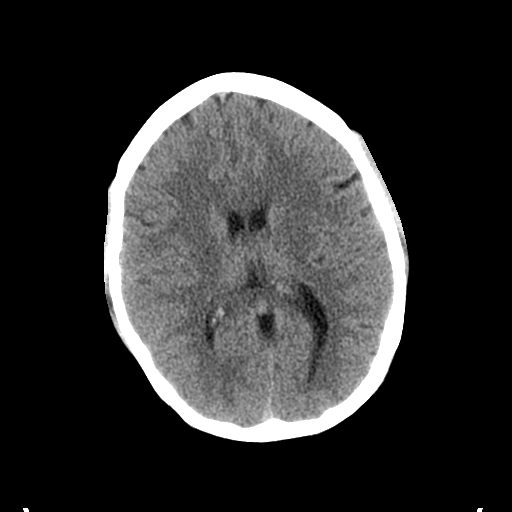
[im 18/29  brain]
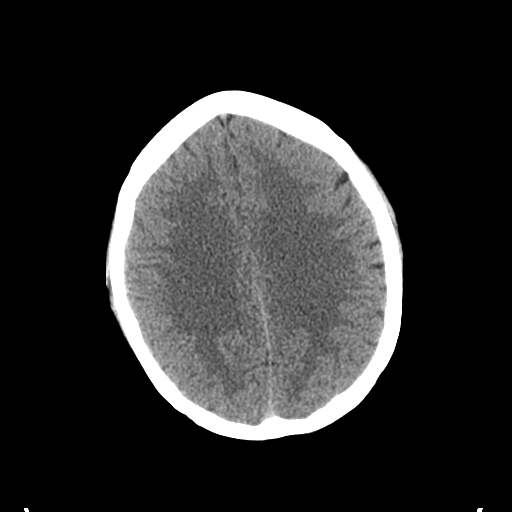
[im 18/29  bone]
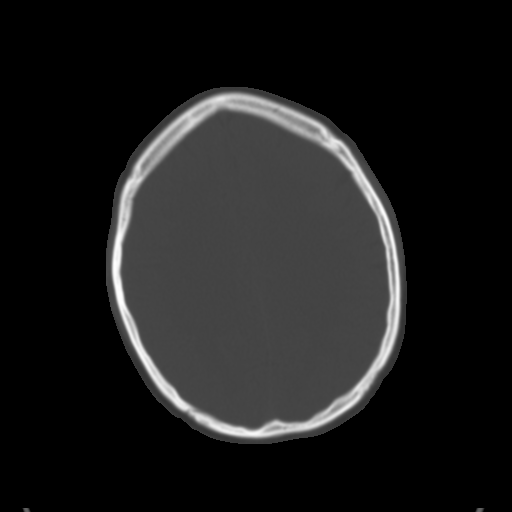
[im 22/29  brain]
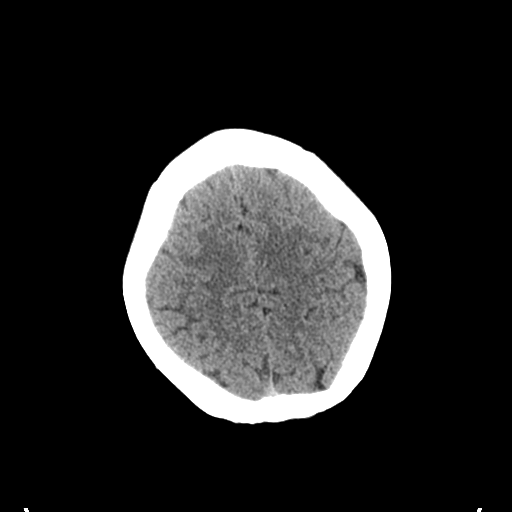
[im 25/29  brain]
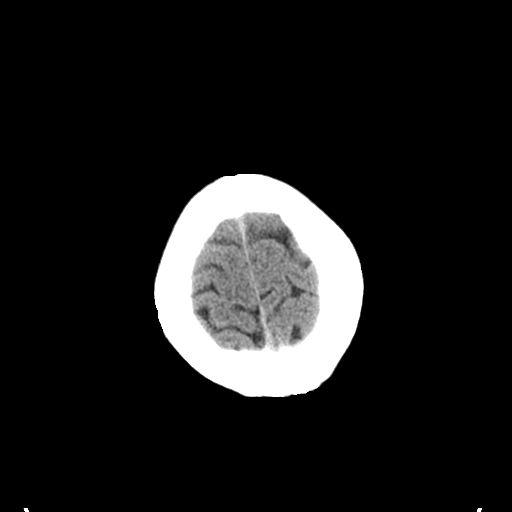

[Series 3: head bone · axial · 0.47mm/px · z∈[+235,+285]mm · 4 of 73 slices shown]
[im 8/73  bone]
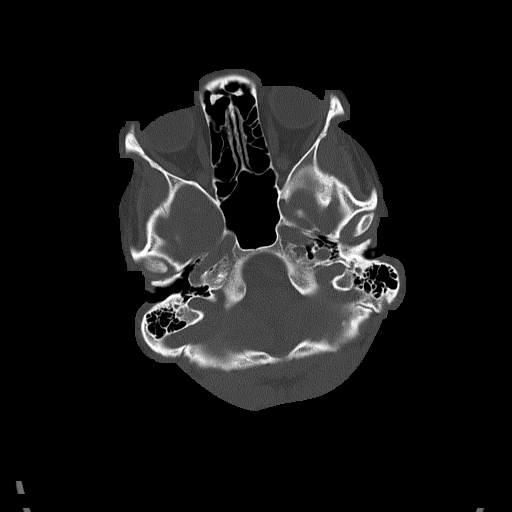
[im 15/73  bone]
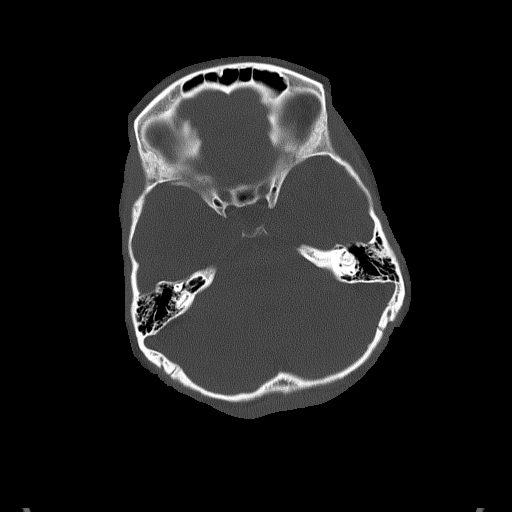
[im 22/73  bone]
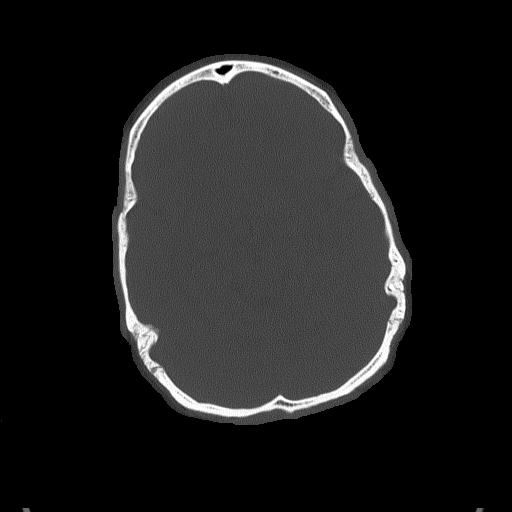
[im 33/73  bone]
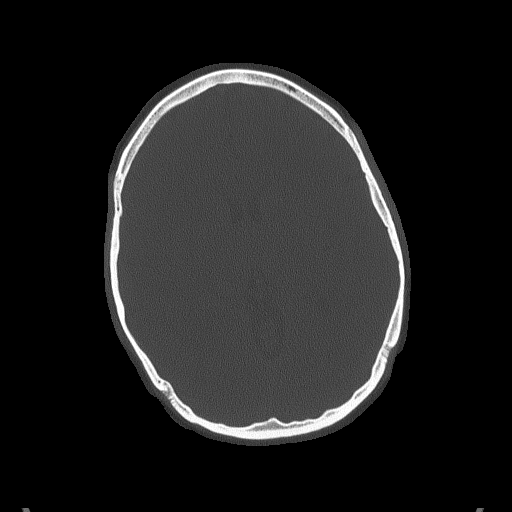

[Series 4: coronal soft · coronal · 0.33mm/px · 3 of 66 slices shown]
[im 22/66  brain]
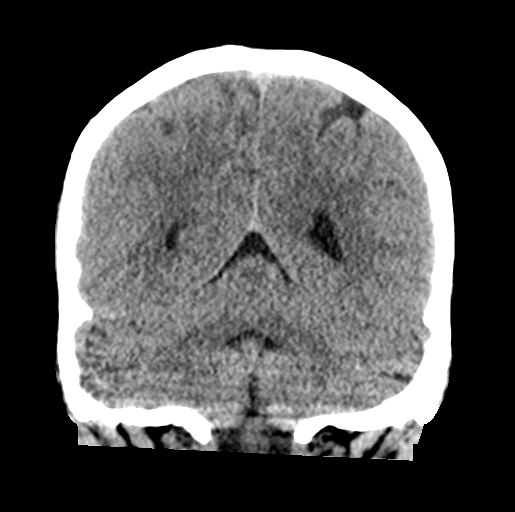
[im 29/66  brain]
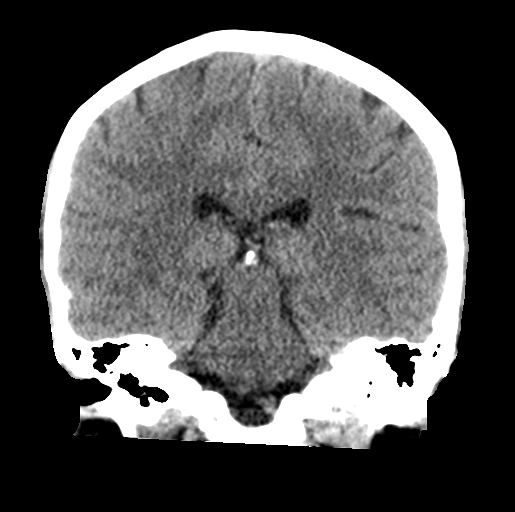
[im 37/66  brain]
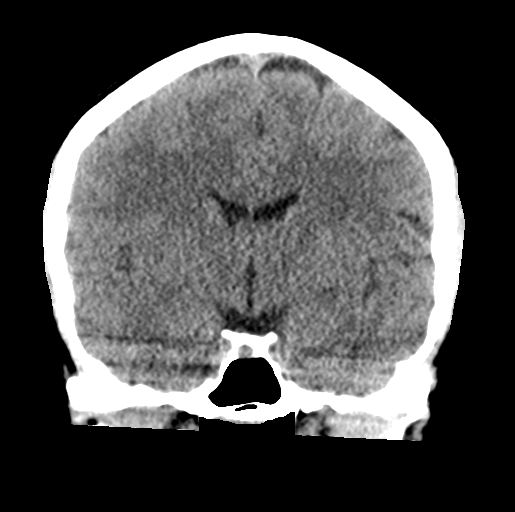

[Series 5: sagittal soft · sagittal · 0.33mm/px · 3 of 57 slices shown]
[im 19/57  brain]
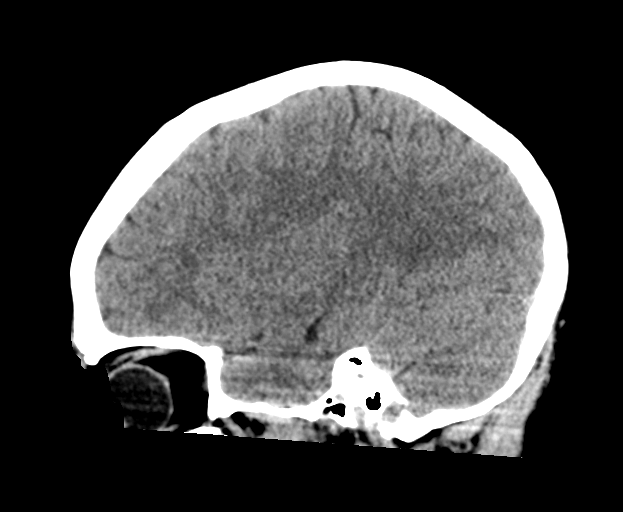
[im 29/57  brain]
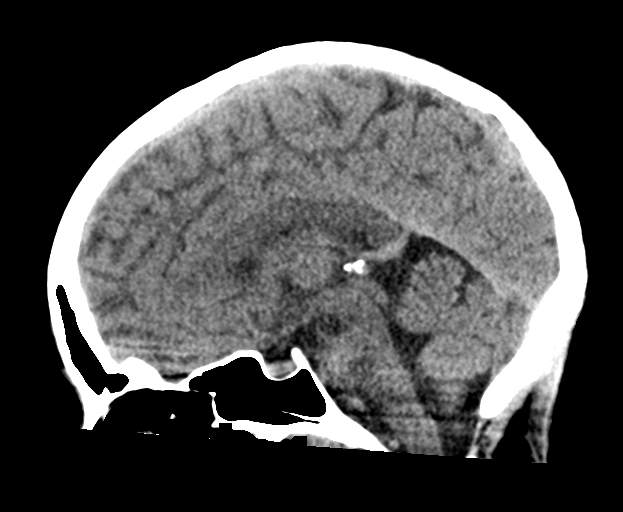
[im 38/57  brain]
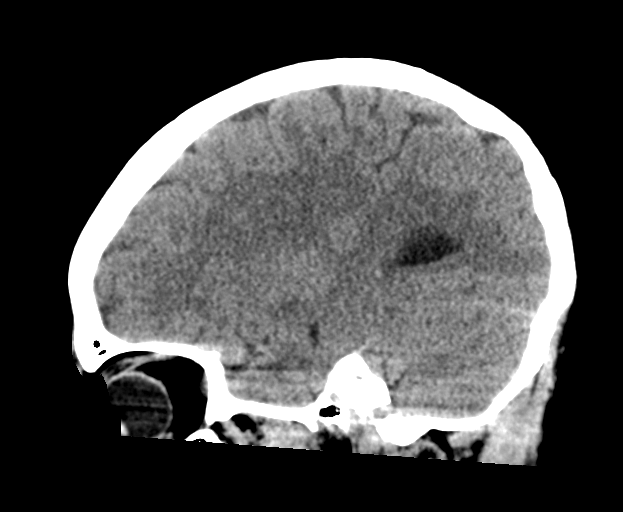

[17 of 47 positions shown; findings below may reference images not displayed]

FINDINGS: Brain: No evidence of acute infarction, hemorrhage, hydrocephalus,
extra-axial collection or mass lesion/mass effect.

Vascular: No hyperdense vessel or unexpected calcification.

Skull: Normal. Negative for fracture or focal lesion.

Sinuses/Orbits: Paranasal sinuses are well aerated. Mastoid air
cells are unremarkable. Orbits are unremarkable.

Other: None.
IMPRESSION: No acute intracranial process.

## 2021-02-20 ENCOUNTER — Other Ambulatory Visit: Payer: Self-pay

## 2021-02-20 ENCOUNTER — Encounter (HOSPITAL_BASED_OUTPATIENT_CLINIC_OR_DEPARTMENT_OTHER): Payer: Self-pay | Admitting: *Deleted

## 2021-02-20 ENCOUNTER — Emergency Department (HOSPITAL_BASED_OUTPATIENT_CLINIC_OR_DEPARTMENT_OTHER)
Admission: EM | Admit: 2021-02-20 | Discharge: 2021-02-20 | Disposition: A | Discharge status: Home / Self Care | Payer: BC Managed Care – PPO | Attending: Emergency Medicine | Admitting: Emergency Medicine

## 2021-02-20 DIAGNOSIS — R42 Dizziness and giddiness: Secondary | ICD-10-CM | POA: Diagnosis not present

## 2021-02-20 DIAGNOSIS — Z5321 Procedure and treatment not carried out due to patient leaving prior to being seen by health care provider: Secondary | ICD-10-CM | POA: Diagnosis not present

## 2021-02-20 LAB — BASIC METABOLIC PANEL
Anion gap: 8 (ref 5–15)
BUN: 8 mg/dL (ref 6–20)
CO2: 29 mmol/L (ref 22–32)
Calcium: 9.4 mg/dL (ref 8.9–10.3)
Chloride: 103 mmol/L (ref 98–111)
Creatinine, Ser: 0.61 mg/dL (ref 0.44–1.00)
GFR, Estimated: >60 mL/min (ref >60)
Glucose, Bld: 110 mg/dL — ABNORMAL HIGH (ref 70–99)
Potassium: 3.9 mmol/L (ref 3.5–5.1)
Sodium: 140 mmol/L (ref 135–145)

## 2021-02-20 LAB — CBC WITH DIFFERENTIAL/PLATELET
Abs Immature Granulocytes: 0.01 10*3/uL (ref 0.00–0.07)
Basophils Absolute: 0 10*3/uL (ref 0.0–0.1)
Basophils Relative: 1 %
Eosinophils Absolute: 0.1 10*3/uL (ref 0.0–0.5)
Eosinophils Relative: 2 %
HCT: 36.3 % (ref 36.0–46.0)
Hemoglobin: 12.5 g/dL (ref 12.0–15.0)
Immature Granulocytes: 0 %
Lymphocytes Relative: 43 %
Lymphs Abs: 1.8 10*3/uL (ref 0.7–4.0)
MCH: 33.8 pg (ref 26.0–34.0)
MCHC: 34.4 g/dL (ref 30.0–36.0)
MCV: 98.1 fL (ref 80.0–100.0)
Monocytes Absolute: 0.3 10*3/uL (ref 0.1–1.0)
Monocytes Relative: 8 %
Neutro Abs: 1.9 10*3/uL (ref 1.7–7.7)
Neutrophils Relative %: 46 %
Platelets: 127 10*3/uL — ABNORMAL LOW (ref 150–400)
RBC: 3.7 MIL/uL — ABNORMAL LOW (ref 3.87–5.11)
RDW: 11.9 % (ref 11.5–15.5)
WBC: 4.2 10*3/uL (ref 4.0–10.5)
nRBC: 0 % (ref 0.0–0.2)

## 2021-02-20 NOTE — ED Triage Notes (Signed)
Dizziness started today.  Pt was just sitting at work.

## 2021-02-20 NOTE — ED Provider Notes (Signed)
Emergency Medicine Provider Triage Evaluation Note  Judith Osborn , a 25 y.o. female  was evaluated in triage.  Pt complains of lightheadedness, feeling foggy, no chest pain or shortness of breath.  Review of Systems  Positive: Lightheadedness, intermittent headache Negative: Neurodeficits, chest pain, shortness of breath  Physical Exam  BP 99/61 (BP Location: Left Arm)   Pulse 65   Temp 98.4 F (36.9 C)   Resp 16   Ht 5\' 4"  (1.626 m)   Wt 59 kg   LMP 02/17/2021   SpO2 100%   BMI 22.31 kg/m  Gen:   Awake, no distress    Resp:  Normal effort   MSK:   Moves extremities without difficulty   Other:     Medical Decision Making  Medically screening exam initiated at 2:44 PM.  Appropriate orders placed.  Judith Osborn was informed that the remainder of the evaluation will be completed by another provider, this initial triage assessment does not replace that evaluation, and the importance of remaining in the ED until their evaluation is complete.      Smitty Pluck, MD 02/20/21 [phone]

## 2023-04-04 ENCOUNTER — Emergency Department (HOSPITAL_COMMUNITY)
Admission: EM | Admit: 2023-04-04 | Discharge: 2023-04-05 | Discharge status: Against Medical Advice | Payer: BC Managed Care – PPO | Attending: Emergency Medicine | Admitting: Emergency Medicine

## 2023-04-04 ENCOUNTER — Other Ambulatory Visit: Payer: Self-pay

## 2023-04-04 ENCOUNTER — Emergency Department (HOSPITAL_COMMUNITY): Payer: BC Managed Care – PPO

## 2023-04-04 ENCOUNTER — Encounter (HOSPITAL_COMMUNITY): Payer: Self-pay | Admitting: Emergency Medicine

## 2023-04-04 DIAGNOSIS — R110 Nausea: Secondary | ICD-10-CM | POA: Diagnosis not present

## 2023-04-04 DIAGNOSIS — Z5321 Procedure and treatment not carried out due to patient leaving prior to being seen by health care provider: Secondary | ICD-10-CM | POA: Insufficient documentation

## 2023-04-04 DIAGNOSIS — R1030 Lower abdominal pain, unspecified: Secondary | ICD-10-CM | POA: Insufficient documentation

## 2023-04-04 LAB — CBC
HCT: 46.1 % — ABNORMAL HIGH (ref 36.0–46.0)
Hemoglobin: 15.7 g/dL — ABNORMAL HIGH (ref 12.0–15.0)
MCH: 36.9 pg — ABNORMAL HIGH (ref 26.0–34.0)
MCHC: 34.1 g/dL (ref 30.0–36.0)
MCV: 108.5 fL — ABNORMAL HIGH (ref 80.0–100.0)
Platelets: 171 10*3/uL (ref 150–400)
RBC: 4.25 MIL/uL (ref 3.87–5.11)
RDW: 12.1 % (ref 11.5–15.5)
WBC: 10.2 10*3/uL (ref 4.0–10.5)
nRBC: 0 % (ref 0.0–0.2)

## 2023-04-04 LAB — COMPREHENSIVE METABOLIC PANEL
ALT: 13 U/L (ref 0–44)
AST: 20 U/L (ref 15–41)
Albumin: 4.4 g/dL (ref 3.5–5.0)
Alkaline Phosphatase: 55 U/L (ref 38–126)
Anion gap: 11 (ref 5–15)
BUN: 5 mg/dL — ABNORMAL LOW (ref 6–20)
CO2: 23 mmol/L (ref 22–32)
Calcium: 9.4 mg/dL (ref 8.9–10.3)
Chloride: 104 mmol/L (ref 98–111)
Creatinine, Ser: 0.86 mg/dL (ref 0.44–1.00)
GFR, Estimated: >60 mL/min (ref >60)
Glucose, Bld: 90 mg/dL (ref 70–99)
Potassium: 3.8 mmol/L (ref 3.5–5.1)
Sodium: 138 mmol/L (ref 135–145)
Total Bilirubin: 0.7 mg/dL (ref 0.3–1.2)
Total Protein: 7.4 g/dL (ref 6.5–8.1)

## 2023-04-04 LAB — URINALYSIS, ROUTINE W REFLEX MICROSCOPIC
Bilirubin Urine: NEGATIVE
Glucose, UA: NEGATIVE mg/dL
Ketones, ur: 5 mg/dL — AB
Leukocytes,Ua: NEGATIVE
Nitrite: NEGATIVE
Protein, ur: 100 mg/dL — AB
Specific Gravity, Urine: 1.017 (ref 1.005–1.030)
pH: 6 (ref 5.0–8.0)

## 2023-04-04 LAB — HCG, SERUM, QUALITATIVE: Preg, Serum: NEGATIVE

## 2023-04-04 LAB — LIPASE, BLOOD: Lipase: 27 U/L (ref 11–51)

## 2023-04-04 NOTE — ED Triage Notes (Signed)
Pain in lower abd started approx 1 hour ago and radiates to lower back. Nausea without vomiting.

## 2023-04-04 NOTE — ED Notes (Signed)
Pt left wbs

## 2023-04-04 NOTE — ED Provider Triage Note (Signed)
Emergency Medicine Provider Triage Evaluation Note  BETANYA PAULIS , a 27 y.o. female  was evaluated in triage.  Pt complains of bilateral lower abdominal pain radiating to back x1 hr. Associate nausea. Pain is sharp and aching. Constant in nature. Felt lightheaded initially with the pain. No urinary symptoms.   Review of Systems  Positive: Abdominal pain Negative: Vomiting  Physical Exam  BP 124/74 (BP Location: Right Arm)   Pulse 86   Temp 98.9 F (37.2 C) (Oral)   Resp 16   Ht 5\' 5"  (1.651 m)   Wt 77.1 kg   LMP 03/08/2023 (Approximate)   SpO2 98%   BMI 28.29 kg/m  Gen:   Awake, no distress   Resp:  Normal effort  MSK:   Moves extremities without difficulty  Other:    Medical Decision Making  Medically screening exam initiated at 7:38 PM.  Appropriate orders placed.  JACOBI SCORZA was informed that the remainder of the evaluation will be completed by another provider, this initial triage assessment does not replace that evaluation, and the importance of remaining in the ED until their evaluation is complete.   Pete Pelt, [place] 04/04/23 1940

## 2024-03-02 DIAGNOSIS — H5213 Myopia, bilateral: Secondary | ICD-10-CM | POA: Diagnosis not present

## 2024-05-15 DIAGNOSIS — Z0000 Encounter for general adult medical examination without abnormal findings: Secondary | ICD-10-CM | POA: Diagnosis not present

## 2024-05-15 DIAGNOSIS — G43009 Migraine without aura, not intractable, without status migrainosus: Secondary | ICD-10-CM | POA: Diagnosis not present

## 2024-05-15 DIAGNOSIS — R110 Nausea: Secondary | ICD-10-CM | POA: Diagnosis not present
